# Patient Record
Sex: Male | Born: 1980
Health system: Southern US, Community
[De-identification: ages and names within clinical notes are randomized; demographics above are authoritative.]

## PROBLEM LIST (undated history)

## (undated) DIAGNOSIS — G43909 Migraine, unspecified, not intractable, without status migrainosus: Secondary | ICD-10-CM

## (undated) HISTORY — DX: Migraine, unspecified, not intractable, without status migrainosus: G43.909

---
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
## 2004-02-17 ENCOUNTER — Encounter: Admission: RE | Admit: 2004-02-17 | Discharge: 2004-02-17 | Payer: Self-pay | Admitting: Family Medicine

## 2004-07-04 ENCOUNTER — Ambulatory Visit: Payer: Self-pay | Admitting: Internal Medicine

## 2005-09-05 ENCOUNTER — Ambulatory Visit: Payer: Self-pay | Admitting: Internal Medicine

## 2005-12-25 ENCOUNTER — Ambulatory Visit: Payer: Self-pay | Admitting: Internal Medicine

## 2007-03-05 ENCOUNTER — Ambulatory Visit: Payer: Self-pay | Admitting: Internal Medicine

## 2007-05-22 ENCOUNTER — Ambulatory Visit: Payer: Self-pay | Admitting: Family Medicine

## 2007-05-22 LAB — CONVERTED CEMR LAB: Rapid Strep: POSITIVE

## 2007-07-12 ENCOUNTER — Emergency Department (HOSPITAL_COMMUNITY): Admission: EM | Admit: 2007-07-12 | Discharge: 2007-07-12 | Payer: Self-pay | Admitting: Emergency Medicine

## 2008-06-06 ENCOUNTER — Ambulatory Visit: Payer: Self-pay | Admitting: Internal Medicine

## 2008-06-06 DIAGNOSIS — J069 Acute upper respiratory infection, unspecified: Secondary | ICD-10-CM | POA: Insufficient documentation
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
## 2011-09-06 ENCOUNTER — Ambulatory Visit (INDEPENDENT_AMBULATORY_CARE_PROVIDER_SITE_OTHER): Payer: Managed Care, Other (non HMO) | Admitting: Family Medicine

## 2011-09-06 ENCOUNTER — Encounter: Payer: Self-pay | Admitting: Family Medicine

## 2011-09-06 VITALS — BP 112/76 | HR 72 | Temp 98.3°F | Wt 201.0 lb

## 2011-09-06 DIAGNOSIS — J069 Acute upper respiratory infection, unspecified: Secondary | ICD-10-CM

## 2011-09-06 DIAGNOSIS — J029 Acute pharyngitis, unspecified: Secondary | ICD-10-CM

## 2011-09-06 MED ORDER — FLUTICASONE PROPIONATE 50 MCG/ACT NA SUSP: 2.0000 | Freq: Every day | NASAL | Status: AC

## 2011-09-06 NOTE — Patient Instructions (Signed)
This appears to be a viral/allergy combo Continue the claritin Add the Flonase- 2 sprays each nostril daily Drink plenty of fluids Ibuprofen as needed for pain or fever REST! Call with any questions or concerns Hang in there!!!

## 2011-09-06 NOTE — Progress Notes (Signed)
  Subjective:    Patient ID: Charles Mccormick, male    DOB: 05/14/1981, 31 y.o.   MRN: [id number]  HPI URI- sxs started 1 week ago w/ sore throat, nasal congestion, + PND.  No facial pain/pressure.  Bilateral ear fullness.  Tm 101 yesterday.  + sick contacts.  Cough is productive.   Review of Systems For ROS see HPI     Objective:   Physical Exam  Vitals reviewed. Constitutional: He appears well-developed and well-nourished. No distress.  HENT:  Head: Normocephalic and atraumatic.       No TTP over sinuses + turbinate edema + PND TMs normal bilaterally  Eyes: Conjunctivae and EOM are normal. Pupils are equal, round, and reactive to light.  Neck: Normal range of motion. Neck supple.  Cardiovascular: Normal rate, regular rhythm and normal heart sounds.   Pulmonary/Chest: Effort normal and breath sounds normal. No respiratory distress. He has no wheezes.  Lymphadenopathy:    He has no cervical adenopathy.  Skin: Skin is warm and dry.          Assessment & Plan:

## 2011-09-08 NOTE — Assessment & Plan Note (Signed)
Pt's sxs and PE consistent w/ viral/allergy combo.  Rapid strep (-) despite recent exposure.  Reviewed supportive care and red flags that should prompt return.  Pt expressed understanding and is in agreement w/ plan.

## 2017-02-10 ENCOUNTER — Ambulatory Visit (INDEPENDENT_AMBULATORY_CARE_PROVIDER_SITE_OTHER): Payer: Managed Care, Other (non HMO) | Admitting: Family Medicine

## 2017-02-10 ENCOUNTER — Encounter: Payer: Self-pay | Admitting: Family Medicine

## 2017-02-10 VITALS — BP 132/82 | HR 71 | Temp 98.3°F | Ht 74.0 in | Wt 191.4 lb

## 2017-02-10 DIAGNOSIS — Z0000 Encounter for general adult medical examination without abnormal findings: Secondary | ICD-10-CM

## 2017-02-10 NOTE — Progress Notes (Signed)
Subjective:    Charles Mccormick is a 36 y.o. male and is here for a comprehensive physical exam.  HPI   Usual eating pattern includes: The patient eats a regular, healthy diet..     Usual physical activity includes doing aerobic activity.  Current life stressors: none.  Wt Readings from Last 3 Encounters:  02/10/17 191 lb 7.2 oz (86.8 kg)  09/06/11 201 lb (91.2 kg)  06/06/08 200 lb 3.2 oz (90.8 kg)   Health Maintenance Due  Topic Date Due  . HIV Screening  04/25/1996  . INFLUENZA VACCINE  01/29/2017   PMHx, SurgHx, SocialHx, Medications, and Allergies were reviewed in the Visit Navigator and updated as appropriate.   Past Medical History:  Diagnosis Date  . Migraines    History reviewed. No pertinent surgical history. History reviewed. No pertinent family history. Social History  Substance Use Topics  . Smoking status: Never Smoker  . Smokeless tobacco: Never Used  . Alcohol use Yes     Comment: occasionally   Review of Systems:   Pertinent items are noted in the HPI. Otherwise, ROS is negative.  Objective:   Vitals:   02/10/17 1530  BP: 132/82  Pulse: 71  Temp: 98.3 F (36.8 C)  SpO2: 97%   Body mass index is 24.58 kg/m.  General Appearance:  Alert, cooperative, no distress, appears stated age  Head:  Normocephalic, without obvious abnormality, atraumatic  Eyes:  PERRL, conjunctiva/corneas clear, EOM's intact, fundi benign, both eyes       Ears:  Normal TM's and external ear canals, both ears  Nose: Nares normal, septum midline, mucosa normal, no drainage    or sinus tenderness  Throat: Lips, mucosa, and tongue normal; teeth and gums normal  Neck: Supple, symmetrical, trachea midline, no adenopathy; thyroid:  No enlargement/tenderness/nodules; no carotit bruit or JVD  Back:   Symmetric, no curvature, ROM normal, no CVA tenderness  Lungs:   Clear to auscultation bilaterally, respirations unlabored  Chest wall:  No tenderness or deformity  Heart:   Regular rate and rhythm, S1 and S2 normal, no murmur, rub   or gallop  Abdomen:   Soft, non-tender, bowel sounds active all four quadrants, no masses, no organomegaly  Extremities: Extremities normal, atraumatic, no cyanosis or edema  Prostate: Not done.   Skin: Skin color, texture, turgor normal, no rashes or lesions  Lymph nodes: Cervical, supraclavicular, and axillary nodes normal  Neurologic: CNII-XII grossly intact. Normal strength, sensation and reflexes throughout    Assessment/Plan:   Charles Mccormick was seen today for establish care.  Diagnoses and all orders for this visit:  Routine physical examination -     Comprehensive metabolic panel -     Lipid panel   Patient Counseling: [x]   Nutrition: Stressed importance of moderation in sodium/caffeine intake, saturated fat and cholesterol, caloric balance, sufficient intake of fresh fruits, vegetables, and fiber.  [x]   Stressed the importance of regular exercise.   [x]   Substance Abuse: Discussed cessation/primary prevention of tobacco, alcohol, or other drug use; driving or other dangerous activities under the influence; availability of treatment for abuse.   [x]   Injury prevention: Discussed safety belts, safety helmets, smoke detector, smoking near bedding or upholstery.   [x]   Sexuality: Discussed sexually transmitted diseases, partner selection, use of condoms, avoidance of unintended pregnancy and contraceptive alternatives.   [x]   Dental health: Discussed importance of regular tooth brushing, flossing, and dental visits.  [x]   Health maintenance and immunizations reviewed. Please refer to Health maintenance  section.   Charles RimaErica Avan Gullett, DO [place] [facility]  [place] served as Neurosurgeonscribe during this visit. History, Physical, and Plan performed by medical provider. The above documentation has been reviewed and is accurate and complete. Charles Mccormick, D.O.

## 2017-02-10 NOTE — Progress Notes (Deleted)
   Charles Mccormick is a 36 y.o. male [facility]   Patient Care Team: Helane RimaWallace, Erica, DO as PCP - General (Family Medicine)   History of Present Illness:   Charles Mccormick, CMA, acting as scribe for Dr. Earlene PlaterWallace.  HPI:  Health Maintenance Due  Topic Date Due  . HIV Screening  04/25/1996  . INFLUENZA VACCINE  01/29/2017   Depression screen PHQ 2/9 02/10/2017  Decreased Interest 0  Down, Depressed, Hopeless 0  PHQ - 2 Score 0   PMHx, SurgHx, SocialHx, Medications, and Allergies were reviewed in the Visit Navigator and updated as appropriate.   Past Medical History:  Diagnosis Date  . Migraines    History reviewed. No pertinent surgical history. History reviewed. No pertinent family history. Social History  Substance Use Topics  . Smoking status: Never Smoker  . Smokeless tobacco: Never Used  . Alcohol use Yes     Comment: occasionally    Current Medications and Allergies:  No current outpatient prescriptions on file.  No Known Allergies Review of Systems:   Pertinent items are noted in the HPI. Otherwise, ROS is negative.  Vitals:   Vitals:   02/10/17 1530  BP: 132/82  Pulse: 71  Temp: 98.3 F (36.8 C)  TempSrc: Oral  SpO2: 97%  Weight: 191 lb 7.2 oz (86.8 kg)  Height: 6\' 2"  (1.88 m)     Body mass index is 24.58 kg/m. Physical Exam:   Physical Exam  Results for orders placed or performed in visit on 05/22/07  Converted [facility]  Result Value Ref Range   Rapid Strep positive     Assessment and Plan:   ***

## 2017-02-18 ENCOUNTER — Telehealth: Payer: Self-pay | Admitting: Family Medicine

## 2017-02-18 NOTE — Telephone Encounter (Signed)
ROI faxed to [facility]

## 2017-02-24 ENCOUNTER — Telehealth: Payer: Self-pay | Admitting: Family Medicine

## 2017-02-24 MED ORDER — SUMATRIPTAN SUCCINATE 50 MG PO TABS: 50.0000 mg | ORAL_TABLET | ORAL | 0 refills | Status: DC | PRN

## 2017-02-24 NOTE — Telephone Encounter (Signed)
Known migraines, so reasonable request. Rx sent to pharmacy.

## 2017-02-24 NOTE — Telephone Encounter (Signed)
Wife requesting new Script for Imetrex.  [facility] [address]  [place]

## 2017-02-24 NOTE — Telephone Encounter (Signed)
Please advise on prescription. We do not have this medication on file.

## 2017-02-25 NOTE — Telephone Encounter (Signed)
Left message that RX has been sent to pharmacy.

## 2017-03-05 ENCOUNTER — Other Ambulatory Visit (INDEPENDENT_AMBULATORY_CARE_PROVIDER_SITE_OTHER): Payer: Managed Care, Other (non HMO)

## 2017-03-05 DIAGNOSIS — Z0000 Encounter for general adult medical examination without abnormal findings: Secondary | ICD-10-CM

## 2017-03-05 LAB — COMPREHENSIVE METABOLIC PANEL
ALT: 21 U/L (ref 0–53)
AST: 18 U/L (ref 0–37)
Albumin: 4.8 g/dL (ref 3.5–5.2)
Alkaline Phosphatase: 72 U/L (ref 39–117)
BUN: 10 mg/dL (ref 6–23)
CO2: 29 mEq/L (ref 19–32)
Calcium: 9.8 mg/dL (ref 8.4–10.5)
Chloride: 100 mEq/L (ref 96–112)
Creatinine, Ser: 0.96 mg/dL (ref 0.40–1.50)
GFR: 94.27 mL/min (ref >60.00)
Glucose, Bld: 95 mg/dL (ref 70–99)
Potassium: 4.4 mEq/L (ref 3.5–5.1)
Sodium: 137 mEq/L (ref 135–145)
Total Bilirubin: 1.1 mg/dL (ref 0.2–1.2)
Total Protein: 7 g/dL (ref 6.0–8.3)

## 2017-03-05 LAB — LIPID PANEL
Cholesterol: 173 mg/dL (ref 0–200)
HDL: 41 mg/dL (ref >39.00)
LDL Cholesterol: 112 mg/dL — ABNORMAL HIGH (ref 0–99)
NonHDL: 131.9
Total CHOL/HDL Ratio: 4
Triglycerides: 98 mg/dL (ref 0.0–149.0)
VLDL: 19.6 mg/dL (ref 0.0–40.0)

## 2017-06-02 ENCOUNTER — Telehealth: Payer: Self-pay

## 2017-06-02 NOTE — Telephone Encounter (Signed)
Please advise 

## 2017-06-02 NOTE — Telephone Encounter (Signed)
Please get more information.

## 2017-06-02 NOTE — Telephone Encounter (Signed)
Left message for patient to return call. We need to get more information on headaches. We need to find out if his headaches are worse and if he has had it more than a week.

## 2017-06-02 NOTE — Telephone Encounter (Signed)
Currently on Imitrex 50 mg po.

## 2017-06-02 NOTE — Telephone Encounter (Signed)
Left message to return call to our office.  

## 2017-06-02 NOTE — Telephone Encounter (Signed)
Pt.'s wife called to report pt. Is still having migraines and has an appointment next week to see provider. States he used to take Imitrex 100 mg po from a previous provider and is requesting an increase in his dosage. Please advise . [facility]. [facility] [address]

## 2017-06-03 ENCOUNTER — Ambulatory Visit: Payer: Self-pay

## 2017-06-04 MED ORDER — SUMATRIPTAN SUCCINATE 100 MG PO TABS: 100.0000 mg | ORAL_TABLET | ORAL | 0 refills | Status: DC | PRN

## 2017-06-04 NOTE — Telephone Encounter (Signed)
Pt wife in office today on DPR. She states that headaches are increasing in pain. That he has had one every day this week. It gets some better over night but increases through day. No changes in vision. He does not have vomiting with headache. He is has been taking  Imitrex 50mg  and otc Advil. Wants to know if we can up Imitrex to 100mg . Until he can get in for follow up with you. Has app for next Tuesday for evaluation with you.   Please call wife back with instructions husband will not answer while at work.  [phone]

## 2017-06-04 NOTE — Addendum Note (Signed)
Addended by: Donnamarie PoagHOMPSON, Marc Sivertsen Y on: 06/04/2017 12:09 PM   Modules accepted: Orders

## 2017-06-04 NOTE — Telephone Encounter (Signed)
Increased called in and called wife to give all information.

## 2017-06-04 NOTE — Telephone Encounter (Signed)
Okay 100 mg Imitrex. Make sure to keep appt.

## 2017-06-10 ENCOUNTER — Ambulatory Visit: Payer: Managed Care, Other (non HMO) | Admitting: Family Medicine

## 2017-06-20 ENCOUNTER — Ambulatory Visit: Payer: Managed Care, Other (non HMO) | Admitting: Family Medicine

## 2017-07-08 ENCOUNTER — Ambulatory Visit: Payer: Managed Care, Other (non HMO) | Admitting: Family Medicine

## 2017-07-18 ENCOUNTER — Ambulatory Visit: Payer: Managed Care, Other (non HMO) | Admitting: Family Medicine

## 2017-07-18 VITALS — BP 116/74 | HR 78 | Temp 98.1°F | Ht 74.0 in | Wt 192.8 lb

## 2017-07-18 DIAGNOSIS — H01136 Eczematous dermatitis of left eye, unspecified eyelid: Secondary | ICD-10-CM

## 2017-07-18 DIAGNOSIS — G43009 Migraine without aura, not intractable, without status migrainosus: Secondary | ICD-10-CM

## 2017-07-18 DIAGNOSIS — L209 Atopic dermatitis, unspecified: Secondary | ICD-10-CM | POA: Diagnosis not present

## 2017-07-18 MED ORDER — TRIAMCINOLONE ACETONIDE 0.025 % EX OINT: 1.0000 "application " | TOPICAL_OINTMENT | Freq: Two times a day (BID) | CUTANEOUS | 0 refills | Status: DC

## 2017-07-18 MED ORDER — TACROLIMUS 0.03 % EX OINT: TOPICAL_OINTMENT | Freq: Two times a day (BID) | CUTANEOUS | 0 refills | Status: AC

## 2017-07-18 MED ORDER — SUMATRIPTAN SUCCINATE 100 MG PO TABS: 100.0000 mg | ORAL_TABLET | ORAL | 12 refills | Status: DC | PRN

## 2017-07-18 NOTE — Progress Notes (Signed)
Charles Mccormick is a 37 y.o. male is here for follow up.  History of Present Illness:   Charles Mccormick CMA acting as scribe for Dr. Earlene PlaterWallace.  HPI: Patient comes in today to discuss migraine medication. His last migraine was about a week ago. He does have a small headache today.    There are no preventive care reminders to display for this patient.   Depression screen PHQ 2/9 02/10/2017  Decreased Interest 0  Down, Depressed, Hopeless 0  PHQ - 2 Score 0   PMHx, SurgHx, SocialHx, FamHx, Medications, and Allergies were reviewed in the Visit Navigator and updated as appropriate.   Patient Active Problem List   Diagnosis Date Noted  . Routine physical examination 02/10/2017  . URI 06/06/2008   Social History   Tobacco Use  . Smoking status: Never Smoker  . Smokeless tobacco: Never Used  Substance Use Topics  . Alcohol use: Yes    Comment: occasionally  . Drug use: No   Current Medications and Allergies:   .  SUMAtriptan (IMITREX) 100 MG tablet, Take 1 tablet (100 mg total) by mouth every 2 (two) hours as needed for migraine. May repeat in 2 hours if headache persists or recurs., Disp: 10 tablet, Rfl: 0  No Known Allergies   Review of Systems   Pertinent items are noted in the HPI. Otherwise, ROS is negative.  Vitals:   Vitals:   07/18/17 1402  BP: 116/74  Pulse: 78  Temp: 98.1 F (36.7 C)  TempSrc: Oral  SpO2: 97%  Weight: 192 lb 12.8 oz (87.5 kg)  Height: 6\' 2"  (1.88 m)     Body mass index is 24.75 kg/m.  Physical Exam:   Physical Exam  Constitutional: He is oriented to person, place, and time. He appears well-developed and well-nourished. No distress.  HENT:  Head: Normocephalic and atraumatic.  Right Ear: External ear normal.  Left Ear: External ear normal.  Nose: Nose normal.  Mouth/Throat: Oropharynx is clear and moist.  Eyes: Conjunctivae and EOM are normal. Pupils are equal, round, and reactive to light.  Neck: Normal range of motion. Neck  supple.  Cardiovascular: Normal rate, regular rhythm, normal heart sounds and intact distal pulses.  Pulmonary/Chest: Effort normal and breath sounds normal.  Abdominal: Soft. Bowel sounds are normal.  Musculoskeletal: Normal range of motion.  Neurological: He is alert and oriented to person, place, and time.  Skin: Skin is warm and dry.  Eczematous lesions on his left upper eyelid and his hand.  Psychiatric: He has a normal mood and affect. His behavior is normal. Judgment and thought content normal.  Nursing note and vitals reviewed.   Assessment and Plan:   1. Migraine without aura and without status migrainosus, not intractable Well controlled.  No signs of complications, medication side effects, or red flags.  Continue current regimen.    - SUMAtriptan (IMITREX) 100 MG tablet; Take 1 tablet (100 mg total) by mouth every 2 (two) hours as needed for migraine. May repeat in 2 hours if headache persists or recurs.  Dispense: 10 tablet; Refill: 12  2. Atopic dermatitis of left eyelid - tacrolimus (PROTOPIC) 0.03 % ointment; Apply topically 2 (two) times daily.  Dispense: 100 g; Refill: 0  3. Atopic dermatitis, mild - triamcinolone (KENALOG) 0.025 % ointment; Apply 1 application topically 2 (two) times daily.  Dispense: 30 g; Refill: 0   . Reviewed expectations re: course of current medical issues. . Discussed self-management of symptoms. . Outlined signs and  symptoms indicating need for more acute intervention. . Patient verbalized understanding and all questions were answered. Marland Kitchen Health Maintenance issues including appropriate healthy diet, exercise, and smoking avoidance were discussed with patient. . See orders for this visit as documented in the electronic medical record. . Patient received an After Visit Summary.  CMA served as Neurosurgeon during this visit. History, Physical, and Plan performed by medical provider. The above documentation has been reviewed and is accurate and  complete. Helane Rima, D.O.  Helane Rima, DO [place] 07/19/2017

## 2017-07-19 DIAGNOSIS — G43009 Migraine without aura, not intractable, without status migrainosus: Secondary | ICD-10-CM | POA: Insufficient documentation

## 2017-07-24 ENCOUNTER — Encounter: Payer: Self-pay | Admitting: Family Medicine

## 2017-07-24 ENCOUNTER — Ambulatory Visit: Payer: Managed Care, Other (non HMO) | Admitting: Family Medicine

## 2017-07-24 VITALS — BP 110/72 | HR 76 | Temp 98.6°F | Wt 196.8 lb

## 2017-07-24 DIAGNOSIS — J060 Acute laryngopharyngitis: Secondary | ICD-10-CM

## 2017-07-24 MED ORDER — AZITHROMYCIN 250 MG PO TABS: ORAL_TABLET | ORAL | 0 refills | Status: DC

## 2017-07-24 MED ORDER — GUAIFENESIN-CODEINE 100-10 MG/5ML PO SYRP: 10.0000 mL | ORAL_SOLUTION | Freq: Every evening | ORAL | 0 refills | Status: DC | PRN

## 2017-07-24 MED ORDER — PREDNISONE 5 MG PO TABS: ORAL_TABLET | ORAL | 0 refills | Status: DC

## 2017-07-24 NOTE — Progress Notes (Signed)
Nathanial RancherBenjamin T Gelber is a 37 y.o. male here for an acute visit.  History of Present Illness:   Barnie MortJoEllen Thompson, CMA acting as scribe for Dr. Helane RimaErica Deronte Solis.   Cough  This is a new problem. The current episode started in the past 7 days. The problem has been gradually worsening. The cough is productive of sputum. Associated symptoms include ear pain, headaches and a sore throat. Pertinent negatives include no ear congestion, fever or wheezing. The symptoms are aggravated by lying down. He has tried nothing for the symptoms. The treatment provided no relief. There is no history of asthma, COPD or emphysema.   PMHx, SurgHx, SocialHx, Medications, and Allergies were reviewed in the Visit Navigator and updated as appropriate.  Current Medications:   .  SUMAtriptan (IMITREX) 100 MG tablet, Take 1 tablet (100 mg total) by mouth every 2 (two) hours as needed for migraine. May repeat in 2 hours if headache persists or recurs., Disp: 10 tablet, Rfl: 12 .  tacrolimus (PROTOPIC) 0.03 % ointment, Apply topically 2 (two) times daily., Disp: 100 g, Rfl: 0 .  triamcinolone (KENALOG) 0.025 % ointment, Apply 1 application topically 2 (two) times daily., Disp: 30 g, Rfl: 0   No Known Allergies   Review of Systems:   Pertinent items are noted in the HPI. Otherwise, ROS is negative.  Vitals:   Vitals:   07/24/17 1304  BP: 110/72  Pulse: 76  Temp: 98.6 F (37 C)  TempSrc: Oral  SpO2: 97%  Weight: 196 lb 12.8 oz (89.3 kg)     Body mass index is 25.27 kg/m.  Physical Exam:   Physical Exam  Constitutional: He is oriented to person, place, and time. He appears well-developed and well-nourished. No distress.  HENT:  Head: Normocephalic and atraumatic.  Right Ear: External ear normal.  Left Ear: External ear normal.  Nose: Mucosal edema and rhinorrhea present.  Mouth/Throat: Posterior oropharyngeal erythema present.  Eyes: Conjunctivae and EOM are normal. Pupils are equal, round, and reactive to  light.  Neck: Normal range of motion. Neck supple.  Cardiovascular: Normal rate, regular rhythm, normal heart sounds and intact distal pulses.  Pulmonary/Chest: Effort normal and breath sounds normal.  Cough.  Abdominal: Soft. Bowel sounds are normal.  Musculoskeletal: Normal range of motion.  Neurological: He is alert and oriented to person, place, and time.  Skin: Skin is warm and dry.  Psychiatric: He has a normal mood and affect. His behavior is normal. Judgment and thought content normal.  Nursing note and vitals reviewed.  Assessment and Plan:   1. Acute laryngopharyngitis Maintain hydration by drinking small amounts of clear fluids frequently. Symptomatic care and red flags reviewed.   - azithromycin (ZITHROMAX) 250 MG tablet; 2 po on day one, then one po daily until gone  (SAFETY NET TO HOLD) - guaiFENesin-codeine (CHERATUSSIN AC) 100-10 MG/5ML syrup; Take 10 mLs by mouth at bedtime as needed for cough or congestion.  Dispense: 120 mL; Refill: 0 - predniSONE (DELTASONE) 5 MG tablet; 6-5-4-3-2-1-off  Dispense: 21 tablet; Refill: 0   . Reviewed expectations re: course of current medical issues. . Discussed self-management of symptoms. . Outlined signs and symptoms indicating need for more acute intervention. . Patient verbalized understanding and all questions were answered. Marland Kitchen. Health Maintenance issues including appropriate healthy diet, exercise, and smoking avoidance were discussed with patient. . See orders for this visit as documented in the electronic medical record. . Patient received an After Visit Summary.  CMA served as Neurosurgeonscribe during  this visit. History, Physical, and Plan performed by medical provider. The above documentation has been reviewed and is accurate and complete. Helane Rima, D.O.   Helane Rima, DO [place] 07/24/2017

## 2017-08-19 ENCOUNTER — Encounter: Payer: Self-pay | Admitting: Family Medicine

## 2017-08-19 ENCOUNTER — Ambulatory Visit: Payer: 59 | Admitting: Family Medicine

## 2017-08-19 VITALS — BP 118/68 | HR 79 | Temp 98.2°F | Ht 74.0 in | Wt 190.6 lb

## 2017-08-19 DIAGNOSIS — R0981 Nasal congestion: Secondary | ICD-10-CM | POA: Diagnosis not present

## 2017-08-19 DIAGNOSIS — R509 Fever, unspecified: Secondary | ICD-10-CM | POA: Diagnosis not present

## 2017-08-19 DIAGNOSIS — R52 Pain, unspecified: Secondary | ICD-10-CM

## 2017-08-19 LAB — POCT INFLUENZA A/B
Influenza A, POC: NEGATIVE
Influenza B, POC: NEGATIVE

## 2017-08-19 MED ORDER — ONDANSETRON 8 MG PO TBDP: 8.0000 mg | ORAL_TABLET | Freq: Three times a day (TID) | ORAL | 0 refills | Status: DC | PRN

## 2017-08-19 NOTE — Progress Notes (Signed)
   Subjective:  Charles RancherBenjamin T Bennis is a 37 y.o. male who presents today for same-day appointment with a chief complaint of diarrhea.   HPI:  Diarrhea, Acute Issue Started 2 days ago.  Improved over last day.  Associated with lower abdominal pain, fever to 101 F, vomiting, and muscle aches.  Also reports upper respiratory symptoms including cough, nasal congestion, and rhinorrhea.  He has had a child who was recently diagnosed with Norovirus.  He has been able to keep down some crackers and water.  No hematochezia.  No melena.  No treatments tried.  No other obvious alleviating or aggravating factors.  ROS: Per HPI  PMH: He reports that  has never smoked. he has never used smokeless tobacco. He reports that he drinks alcohol. He reports that he does not use drugs.  Objective:  Physical Exam: BP 118/68 (BP Location: Left Arm, Patient Position: Sitting, Cuff Size: Normal)   Pulse 79   Temp 98.2 F (36.8 C) (Oral)   Ht 6\' 2"  (1.88 m)   Wt 190 lb 9.6 oz (86.5 kg)   SpO2 96%   BMI 24.47 kg/m   Gen: NAD, resting comfortably CV: RRR with no murmurs appreciated Pulm: NWOB, CTAB with no crackles, wheezes, or rhonchi GI: Normal bowel sounds present. Soft, Nontender, Nondistended.  Results for orders placed or performed in visit on 08/19/17 (from the past 24 hour(s))  POCT Influenza A/B     Status: None   Collection Time: 08/19/17  9:11 AM  Result Value Ref Range   Influenza A, POC Negative Negative   Influenza B, POC Negative Negative   Assessment/Plan:  Diarrhea Likely viral gastroenteritis.  His abdominal exam is benign and he does not have any red flag signs or symptoms.  His rapid flu test is negative.  We will treat symptomatically.  Sent in a prescription for Zofran.  Also recommended Imodium for his diarrhea.  Encouraged good oral hydration.  Recommended Tylenol and/or Motrin as needed for pain and fever.  Strict return precautions reviewed.  Follow-up as needed.  Katina Degreealeb M. Jimmey RalphParker,  MD 08/19/2017 9:13 AM

## 2017-08-19 NOTE — Patient Instructions (Signed)
Please start the Zofran.  You can take every 8 hours over the next couple days.  After that, you can take as needed.  Please start Imodium for your diarrhea.  Please stay well-hydrated.  You can take Tylenol and/or Motrin as needed for pain and fever.  Let me know if your symptoms worsen or do not improve over the next several days.  Take care, Dr. Jimmey RalphParker

## 2017-09-03 ENCOUNTER — Other Ambulatory Visit: Payer: Self-pay

## 2017-09-03 ENCOUNTER — Ambulatory Visit: Payer: Self-pay | Admitting: *Deleted

## 2017-09-03 MED ORDER — OSELTAMIVIR PHOSPHATE 75 MG PO CAPS: 75.0000 mg | ORAL_CAPSULE | Freq: Every day | ORAL | 0 refills | Status: DC

## 2017-09-03 NOTE — Telephone Encounter (Signed)
Patient informed meds called in

## 2017-09-03 NOTE — Telephone Encounter (Signed)
Okay to call in prophylaxis.  Tamiflu 75 daily times 7 days.

## 2017-09-03 NOTE — Telephone Encounter (Addendum)
Called pt's wife back, Amy, she states that their 37 year old was diagnosed with the flu, and all 5 family members have just gotten a stomach bug; she also states that the pt is not exhibiting any symptoms of the flu and is unable to call because he is at work. Pt can be contacted at [phone]; will route to office for final disposition. [facility] [address] [place].   Reason for Disposition . [1] Influenza EXPOSURE within last 72 hours (3 days) AND [2] NOT HIGH RISK AND [3] strongly requests antiviral medication  Answer Assessment - Initial Assessment Questions 1. TYPE of EXPOSURE: "How were you exposed?" (e.g., close contact, not a close contact)     37 year old 2. DATE of EXPOSURE: "When did the exposure occur?" (e.g., hour, days, weeks)     1; child woke up with it yesterday  3. PREGNANCY: "Is there any chance you are pregnant?" "When was your last menstrual period?"     n/a 4. HIGH RISK for COMPLICATIONS: "Do you have any heart or lung problems? Do you have a weakened immune system?" (e.g., CHF, COPD, asthma, HIV positive, chemotherapy, renal failure, diabetes mellitus, sickle cell anemia)     no 5. SYMPTOMS: "Do you have any symptoms?" (e.g., cough, fever, sore throat, difficulty breathing).     no  Protocols used: INFLUENZA EXPOSURE-A-AH

## 2018-02-13 ENCOUNTER — Encounter: Payer: 59 | Admitting: Family Medicine

## 2018-04-07 NOTE — Progress Notes (Signed)
Subjective:    Charles Mccormick is a 37 y.o. male who presents today for his Complete Annual Exam.    Current Outpatient Medications:  .  SUMAtriptan (IMITREX) 100 MG tablet, Take 1 tablet (100 mg total) by mouth every 2 (two) hours as needed for migraine. May repeat in 2 hours if headache persists or recurs., Disp: 10 tablet, Rfl: 12 .  tacrolimus (PROTOPIC) 0.03 % ointment, Apply topically 2 (two) times daily., Disp: 100 g, Rfl: 0 .  triamcinolone (KENALOG) 0.025 % ointment, Apply 1 application topically 2 (two) times daily., Disp: 30 g, Rfl: 0  Health Maintenance Due  Topic Date Due  . INFLUENZA VACCINE  01/29/2018    PMHx, SurgHx, SocialHx, Medications, and Allergies were reviewed in the Visit Navigator and updated as appropriate.   Past Medical History:  Diagnosis Date  . Migraines    History reviewed. No pertinent surgical history.  History reviewed. No pertinent family history.  Social History   Tobacco Use  . Smoking status: Never Smoker  . Smokeless tobacco: Never Used  Substance Use Topics  . Alcohol use: Yes    Comment: occasionally  . Drug use: No   Review of Systems:   Pertinent items are noted in the HPI. Otherwise, ROS is negative.  Objective:   Vitals:   04/08/18 0724  BP: 110/62  Pulse: 60  Temp: 98.2 F (36.8 C)  SpO2: 98%   Body mass index is 25.55 kg/m.  General Appearance:  Alert, cooperative, no distress, appears stated age  Head:  Normocephalic, without obvious abnormality, atraumatic  Eyes:  PERRL, conjunctiva/corneas clear, EOM's intact, fundi benign, both eyes       Ears:  Normal TM's and external ear canals, both ears  Nose: Nares normal, septum midline, mucosa normal, no drainage    or sinus tenderness  Throat: Lips, mucosa, and tongue normal; teeth and gums normal  Neck: Supple, symmetrical, trachea midline, no adenopathy; thyroid:  No enlargement/tenderness/nodules; no carotit bruit or JVD  Back:   Symmetric, no curvature,  ROM normal, no CVA tenderness  Lungs:   Clear to auscultation bilaterally, respirations unlabored  Chest wall:  No tenderness or deformity  Heart:  Regular rate and rhythm, S1 and S2 normal, no murmur, rub   or gallop  Abdomen:   Soft, non-tender, bowel sounds active all four quadrants, no masses, no organomegaly  Extremities: Extremities normal, atraumatic, no cyanosis or edema  Prostate: Not done.   Skin: Skin color, texture, turgor normal, no rashes or lesions  Lymph nodes: Cervical, supraclavicular, and axillary nodes normal  Neurologic: CNII-XII grossly intact. Normal strength, sensation and reflexes throughout    Assessment/Plan:   Casson was seen today for follow-up and annual exam.  Diagnoses and all orders for this visit:  Routine physical examination  Pure hypercholesterolemia -     Comprehensive metabolic panel -     Lipid panel  Atopic dermatitis, mild -     CBC with Differential/Platelet   Patient Counseling: [x]   Nutrition: Stressed importance of moderation in sodium/caffeine intake, saturated fat and cholesterol, caloric balance, sufficient intake of fresh fruits, vegetables, and fiber.  [x]   Stressed the importance of regular exercise.   []   Substance Abuse: Discussed cessation/primary prevention of tobacco, alcohol, or other drug use; driving or other dangerous activities under the influence; availability of treatment for abuse.   [x]   Injury prevention: Discussed safety belts, safety helmets, smoke detector, smoking near bedding or upholstery.   []   Sexuality: Discussed  sexually transmitted diseases, partner selection, use of condoms, avoidance of unintended pregnancy and contraceptive alternatives.   [x]   Dental health: Discussed importance of regular tooth brushing, flossing, and dental visits.  [x]   Health maintenance and immunizations reviewed. Please refer to Health maintenance section.    Helane Rima, DO [facility]

## 2018-04-08 ENCOUNTER — Ambulatory Visit (INDEPENDENT_AMBULATORY_CARE_PROVIDER_SITE_OTHER): Payer: 59 | Admitting: Family Medicine

## 2018-04-08 ENCOUNTER — Encounter: Payer: Self-pay | Admitting: Family Medicine

## 2018-04-08 VITALS — BP 110/62 | HR 60 | Temp 98.2°F | Ht 74.0 in | Wt 199.0 lb

## 2018-04-08 DIAGNOSIS — Z0000 Encounter for general adult medical examination without abnormal findings: Secondary | ICD-10-CM | POA: Diagnosis not present

## 2018-04-08 DIAGNOSIS — E7800 Pure hypercholesterolemia, unspecified: Secondary | ICD-10-CM | POA: Diagnosis not present

## 2018-04-08 DIAGNOSIS — L209 Atopic dermatitis, unspecified: Secondary | ICD-10-CM | POA: Insufficient documentation

## 2018-04-08 DIAGNOSIS — E785 Hyperlipidemia, unspecified: Secondary | ICD-10-CM | POA: Insufficient documentation

## 2018-04-08 LAB — CBC WITH DIFFERENTIAL/PLATELET
Basophils Absolute: 0.1 10*3/uL (ref 0.0–0.1)
Basophils Relative: 1 % (ref 0.0–3.0)
Eosinophils Absolute: 0.1 10*3/uL (ref 0.0–0.7)
Eosinophils Relative: 2.4 % (ref 0.0–5.0)
HCT: 47.3 % (ref 39.0–52.0)
Hemoglobin: 16.2 g/dL (ref 13.0–17.0)
Lymphocytes Relative: 29.4 % (ref 12.0–46.0)
Lymphs Abs: 1.7 10*3/uL (ref 0.7–4.0)
MCHC: 34.2 g/dL (ref 30.0–36.0)
MCV: 85 fl (ref 78.0–100.0)
Monocytes Absolute: 0.5 10*3/uL (ref 0.1–1.0)
Monocytes Relative: 9.3 % (ref 3.0–12.0)
Neutro Abs: 3.4 10*3/uL (ref 1.4–7.7)
Neutrophils Relative %: 57.9 % (ref 43.0–77.0)
Platelets: 251 10*3/uL (ref 150.0–400.0)
RBC: 5.57 Mil/uL (ref 4.22–5.81)
RDW: 12.9 % (ref 11.5–15.5)
WBC: 5.9 10*3/uL (ref 4.0–10.5)

## 2018-04-08 LAB — COMPREHENSIVE METABOLIC PANEL
ALT: 27 U/L (ref 0–53)
AST: 21 U/L (ref 0–37)
Albumin: 4.5 g/dL (ref 3.5–5.2)
Alkaline Phosphatase: 68 U/L (ref 39–117)
BUN: 15 mg/dL (ref 6–23)
CO2: 29 mEq/L (ref 19–32)
Calcium: 9.3 mg/dL (ref 8.4–10.5)
Chloride: 103 mEq/L (ref 96–112)
Creatinine, Ser: 0.99 mg/dL (ref 0.40–1.50)
GFR: 90.43 mL/min (ref >60.00)
Glucose, Bld: 101 mg/dL — ABNORMAL HIGH (ref 70–99)
Potassium: 3.8 mEq/L (ref 3.5–5.1)
Sodium: 138 mEq/L (ref 135–145)
Total Bilirubin: 0.8 mg/dL (ref 0.2–1.2)
Total Protein: 7.2 g/dL (ref 6.0–8.3)

## 2018-04-08 LAB — LIPID PANEL
Cholesterol: 155 mg/dL (ref 0–200)
HDL: 38.2 mg/dL — ABNORMAL LOW (ref >39.00)
LDL Cholesterol: 101 mg/dL — ABNORMAL HIGH (ref 0–99)
NonHDL: 117.28
Total CHOL/HDL Ratio: 4
Triglycerides: 83 mg/dL (ref 0.0–149.0)
VLDL: 16.6 mg/dL (ref 0.0–40.0)

## 2018-08-03 ENCOUNTER — Other Ambulatory Visit: Payer: Self-pay | Admitting: Family Medicine

## 2018-08-03 DIAGNOSIS — G43009 Migraine without aura, not intractable, without status migrainosus: Secondary | ICD-10-CM

## 2019-04-09 ENCOUNTER — Encounter: Payer: 59 | Admitting: Family Medicine

## 2019-04-22 ENCOUNTER — Other Ambulatory Visit: Payer: Self-pay

## 2019-04-22 ENCOUNTER — Ambulatory Visit (INDEPENDENT_AMBULATORY_CARE_PROVIDER_SITE_OTHER): Payer: 59 | Admitting: Family Medicine

## 2019-04-22 ENCOUNTER — Encounter: Payer: Self-pay | Admitting: Family Medicine

## 2019-04-22 VITALS — BP 110/72 | HR 67 | Temp 98.5°F | Ht 74.0 in | Wt 196.4 lb

## 2019-04-22 DIAGNOSIS — Z0001 Encounter for general adult medical examination with abnormal findings: Secondary | ICD-10-CM | POA: Diagnosis not present

## 2019-04-22 DIAGNOSIS — R739 Hyperglycemia, unspecified: Secondary | ICD-10-CM | POA: Diagnosis not present

## 2019-04-22 DIAGNOSIS — G43009 Migraine without aura, not intractable, without status migrainosus: Secondary | ICD-10-CM

## 2019-04-22 DIAGNOSIS — Z6825 Body mass index (BMI) 25.0-25.9, adult: Secondary | ICD-10-CM

## 2019-04-22 DIAGNOSIS — E663 Overweight: Secondary | ICD-10-CM

## 2019-04-22 DIAGNOSIS — R7301 Impaired fasting glucose: Secondary | ICD-10-CM | POA: Insufficient documentation

## 2019-04-22 DIAGNOSIS — L209 Atopic dermatitis, unspecified: Secondary | ICD-10-CM

## 2019-04-22 DIAGNOSIS — E785 Hyperlipidemia, unspecified: Secondary | ICD-10-CM | POA: Diagnosis not present

## 2019-04-22 LAB — COMPREHENSIVE METABOLIC PANEL
ALT: 24 U/L (ref 0–53)
AST: 21 U/L (ref 0–37)
Albumin: 4.6 g/dL (ref 3.5–5.2)
Alkaline Phosphatase: 80 U/L (ref 39–117)
BUN: 10 mg/dL (ref 6–23)
CO2: 28 mEq/L (ref 19–32)
Calcium: 9.3 mg/dL (ref 8.4–10.5)
Chloride: 102 mEq/L (ref 96–112)
Creatinine, Ser: 0.97 mg/dL (ref 0.40–1.50)
GFR: 86.62 mL/min (ref >60.00)
Glucose, Bld: 87 mg/dL (ref 70–99)
Potassium: 4.2 mEq/L (ref 3.5–5.1)
Sodium: 138 mEq/L (ref 135–145)
Total Bilirubin: 0.7 mg/dL (ref 0.2–1.2)
Total Protein: 6.7 g/dL (ref 6.0–8.3)

## 2019-04-22 LAB — CBC
HCT: 46.2 % (ref 39.0–52.0)
Hemoglobin: 15.7 g/dL (ref 13.0–17.0)
MCHC: 34 g/dL (ref 30.0–36.0)
MCV: 85.7 fl (ref 78.0–100.0)
Platelets: 262 10*3/uL (ref 150.0–400.0)
RBC: 5.4 Mil/uL (ref 4.22–5.81)
RDW: 12.9 % (ref 11.5–15.5)
WBC: 5.5 10*3/uL (ref 4.0–10.5)

## 2019-04-22 LAB — TSH: TSH: 0.79 u[IU]/mL (ref 0.35–4.50)

## 2019-04-22 LAB — LIPID PANEL
Cholesterol: 169 mg/dL (ref 0–200)
HDL: 34.1 mg/dL — ABNORMAL LOW (ref >39.00)
LDL Cholesterol: 113 mg/dL — ABNORMAL HIGH (ref 0–99)
NonHDL: 135.23
Total CHOL/HDL Ratio: 5
Triglycerides: 113 mg/dL (ref 0.0–149.0)
VLDL: 22.6 mg/dL (ref 0.0–40.0)

## 2019-04-22 LAB — HEMOGLOBIN A1C: Hgb A1c MFr Bld: 5.2 % (ref 4.6–6.5)

## 2019-04-22 NOTE — Assessment & Plan Note (Signed)
Check A1c. 

## 2019-04-22 NOTE — Progress Notes (Signed)
Chief Complaint:  Charles Mccormick is a 38 y.o. male who presents today for his annual comprehensive physical exam and to transfer care.   Assessment/Plan:  Hyperglycemia Check A1c.  Dyslipidemia Check lipid panel, CBC, C met, and TSH.  Atopic dermatitis, mild Continue Protopic and triamcinolone as needed.  Migraine without aura and without status migrainosus, not intractable Continue Imitrex as needed.  Left Hip Pain Continue management per orthopedics.  Body mass index is 25.21 kg/m. / Overweight BMI Metric Follow Up - 04/22/19 0843      BMI Metric Follow Up-Please document annually   BMI Metric Follow Up  Education provided        Preventative Healthcare: Flu vaccine declined today.   Patient Counseling(The following topics were reviewed and/or handout was given):  -Nutrition: Stressed importance of moderation in sodium/caffeine intake, saturated fat and cholesterol, caloric balance, sufficient intake of fresh fruits, vegetables, and fiber.  -Stressed the importance of regular exercise.   -Substance Abuse: Discussed cessation/primary prevention of tobacco, alcohol, or other drug use; driving or other dangerous activities under the influence; availability of treatment for abuse.   -Injury prevention: Discussed safety belts, safety helmets, smoke detector, smoking near bedding or upholstery.   -Sexuality: Discussed sexually transmitted diseases, partner selection, use of condoms, avoidance of unintended pregnancy and contraceptive alternatives.   -Dental health: Discussed importance of regular tooth brushing, flossing, and dental visits.  -Health maintenance and immunizations reviewed. Please refer to Health maintenance section.  Return to care in 1 year for next preventative visit.     Subjective:  HPI:  He has no acute complaints today.   He is currently seeing [facility] for hip pain. This started a few months ago. Stable over that time. Had MRI done  yesteryda.   His stable, chronic medical conditions are outlined below:   # Atopic Dermatitis - On protopic twice daily as needed and triamcinolone 0.025% twice daily as needed  # Migraines - Takex imitrex 158m as needed  Lifestyle Diet: No specific diets or eating  Exercise: Limited due to hip pain. Likes to walk. Used to run.   [facility]  Decreased Interest 0  Down, Depressed, Hopeless 0  PHQ - 2 Score 0   ROS: Per HPI, otherwise a complete review of systems was negative.   PMH:  The following were reviewed and entered/updated in epic: Past Medical History:  Diagnosis Date  . Migraines    Patient Active Problem List   Diagnosis Date Noted  . Dyslipidemia 04/22/2019  . Hyperglycemia 04/22/2019  . Atopic dermatitis, mild 04/08/2018  . Migraine without aura and without status migrainosus, not intractable 07/19/2017   History reviewed. No pertinent surgical history.  Family History  Problem Relation Age of Onset  . Breast cancer Mother     Medications- reviewed and updated Current Outpatient Medications  Medication Sig Dispense Refill  . SUMAtriptan (IMITREX) 100 MG tablet TAKE 1 TABLET BYMOUTH FOR MIGRAINE MAY REPEAT IN 2 HOURS IF HEADACHE PERSISTS OR RECURS 10 tablet 12  . tacrolimus (PROTOPIC) 0.03 % ointment Apply topically 2 (two) times daily. 100 g 0  . triamcinolone (KENALOG) 0.025 % ointment Apply 1 application topically 2 (two) times daily. 30 g 0   No current facility-administered medications for this visit.     Allergies-reviewed and updated No Known Allergies  Social History   Socioeconomic History  . Marital status: Married    Spouse name: Not on file  . Number of children: Not on  file  . Years of education: Not on file  . Highest education level: Not on file  Occupational History  . Not on file  Social Needs  . Financial resource strain: Not on file  . Food insecurity    Worry: Not on file    Inability: Not  on file  . Transportation needs    Medical: Not on file    Non-medical: Not on file  Tobacco Use  . Smoking status: Never Smoker  . Smokeless tobacco: Never Used  Substance and Sexual Activity  . Alcohol use: Yes    Comment: occasionally  . Drug use: No  . Sexual activity: Yes    Partners: Female  Lifestyle  . Physical activity    Days per week: Not on file    Minutes per session: Not on file  . Stress: Not on file  Relationships  . Social Herbalist on phone: Not on file    Gets together: Not on file    Attends religious service: Not on file    Active member of club or organization: Not on file    Attends meetings of clubs or organizations: Not on file    Relationship status: Not on file  Other Topics Concern  . Not on file  Social History Narrative  . Not on file        Objective:  Physical Exam: BP 110/72   Pulse 67   Temp 98.5 F (36.9 C)   Ht '6\' 2"'  (1.88 m)   Wt 196 lb 6.1 oz (89.1 kg)   SpO2 98%   BMI 25.21 kg/m   Body mass index is 25.21 kg/m. Wt Readings from Last 3 Encounters:  04/22/19 196 lb 6.1 oz (89.1 kg)  04/08/18 199 lb (90.3 kg)  08/19/17 190 lb 9.6 oz (86.5 kg)   Gen: NAD, resting comfortably HEENT: TMs normal bilaterally. OP clear. No thyromegaly noted.  CV: RRR with no murmurs appreciated Pulm: NWOB, CTAB with no crackles, wheezes, or rhonchi GI: Normal bowel sounds present. Soft, Nontender, Nondistended. MSK: no edema, cyanosis, or clubbing noted Skin: warm, dry Neuro: CN2-12 grossly intact. Strength 5/5 in upper and lower extremities. Reflexes symmetric and intact bilaterally.  Psych: Normal affect and thought content     Zimal Weisensel M. Jerline Pain, MD 04/22/2019 8:44 AM

## 2019-04-22 NOTE — Patient Instructions (Signed)
It was very nice to see you today!  Keep up the good work!  We will check blood work today.  Come back to see me in 1 year for your next physical, or sooner if needed.  Take care, Dr Jerline Pain  Please try these tips to maintain a healthy lifestyle:   Eat at least 3 REAL meals and 1-2 snacks per day.  Aim for no more than 5 hours between eating.  If you eat breakfast, please do so within one hour of getting up.    Obtain twice as many fruits/vegetables as protein or carbohydrate foods for both lunch and dinner. (Half of each meal should be fruits/vegetables, one quarter protein, and one quarter starchy carbs)   Cut down on sweet beverages. This includes juice, soda, and sweet tea.    Exercise at least 150 minutes every week.    Preventive Care 38-41 Years Old, Male Preventive care refers to lifestyle choices and visits with your health care provider that can promote health and wellness. This includes:  A yearly physical exam. This is also called an annual well check.  Regular dental and eye exams.  Immunizations.  Screening for certain conditions.  Healthy lifestyle choices, such as eating a healthy diet, getting regular exercise, not using drugs or products that contain nicotine and tobacco, and limiting alcohol use. What can I expect for my preventive care visit? Physical exam Your health care provider will check:  Height and weight. These may be used to calculate body mass index (BMI), which is a measurement that tells if you are at a healthy weight.  Heart rate and blood pressure.  Your skin for abnormal spots. Counseling Your health care provider may ask you questions about:  Alcohol, tobacco, and drug use.  Emotional well-being.  Home and relationship well-being.  Sexual activity.  Eating habits.  Work and work Statistician. What immunizations do I need?  Influenza (flu) vaccine  This is recommended every year. Tetanus, diphtheria, and pertussis  (Tdap) vaccine  You may need a Td booster every 10 years. Varicella (chickenpox) vaccine  You may need this vaccine if you have not already been vaccinated. Human papillomavirus (HPV) vaccine  If recommended by your health care provider, you may need three doses over 6 months. Measles, mumps, and rubella (MMR) vaccine  You may need at least one dose of MMR. You may also need a second dose. Meningococcal conjugate (MenACWY) vaccine  One dose is recommended if you are 24-77 years old and a Market researcher living in a residence hall, or if you have one of several medical conditions. You may also need additional booster doses. Pneumococcal conjugate (PCV13) vaccine  You may need this if you have certain conditions and were not previously vaccinated. Pneumococcal polysaccharide (PPSV23) vaccine  You may need one or two doses if you smoke cigarettes or if you have certain conditions. Hepatitis A vaccine  You may need this if you have certain conditions or if you travel or work in places where you may be exposed to hepatitis A. Hepatitis B vaccine  You may need this if you have certain conditions or if you travel or work in places where you may be exposed to hepatitis B. Haemophilus influenzae type b (Hib) vaccine  You may need this if you have certain risk factors. You may receive vaccines as individual doses or as more than one vaccine together in one shot (combination vaccines). Talk with your health care provider about the risks and benefits of  combination vaccines. What tests do I need? Blood tests  Lipid and cholesterol levels. These may be checked every 5 years starting at age 72.  Hepatitis C test.  Hepatitis B test. Screening   Diabetes screening. This is done by checking your blood sugar (glucose) after you have not eaten for a while (fasting).  Sexually transmitted disease (STD) testing. Talk with your health care provider about your test results, treatment  options, and if necessary, the need for more tests. Follow these instructions at home: Eating and drinking   Eat a diet that includes fresh fruits and vegetables, whole grains, lean protein, and low-fat dairy products.  Take vitamin and mineral supplements as recommended by your health care provider.  Do not drink alcohol if your health care provider tells you not to drink.  If you drink alcohol: ? Limit how much you have to 0-2 drinks a day. ? Be aware of how much alcohol is in your drink. In the [place] one drink equals one 12 oz bottle of beer (355 mL), one 5 oz glass of wine (148 mL), or one 1 oz glass of hard liquor (44 mL). Lifestyle  Take daily care of your teeth and gums.  Stay active. Exercise for at least 30 minutes on 5 or more days each week.  Do not use any products that contain nicotine or tobacco, such as cigarettes, e-cigarettes, and chewing tobacco. If you need help quitting, ask your health care provider.  If you are sexually active, practice safe sex. Use a condom or other form of protection to prevent STIs (sexually transmitted infections). What's next?  Go to your health care provider once a year for a well check visit.  Ask your health care provider how often you should have your eyes and teeth checked.  Stay up to date on all vaccines. This information is not intended to replace advice given to you by your health care provider. Make sure you discuss any questions you have with your health care provider. Document Released: 08/13/2001 Document Revised: 06/11/2018 Document Reviewed: 06/11/2018 Elsevier Patient Education  2020 [employer].

## 2019-04-22 NOTE — Assessment & Plan Note (Signed)
Continue Protopic and triamcinolone as needed.

## 2019-04-22 NOTE — Assessment & Plan Note (Signed)
Check lipid panel, CBC, C met, and TSH. 

## 2019-04-22 NOTE — Progress Notes (Signed)
Please inform patient of the following:  Cholesterol levels are borderline elevated but all of his other labs are NORMAL. Do not need to start medications, recommend that he work on diet and exercise and we can recheck in a year or so.   Charles Mccormick. Jerline Pain, MD 04/22/2019 2:13 PM

## 2019-04-22 NOTE — Assessment & Plan Note (Signed)
Continue Imitrex as needed 

## 2019-07-13 DIAGNOSIS — M25552 Pain in left hip: Secondary | ICD-10-CM | POA: Diagnosis not present

## 2019-07-19 DIAGNOSIS — J069 Acute upper respiratory infection, unspecified: Secondary | ICD-10-CM | POA: Diagnosis not present

## 2019-07-19 DIAGNOSIS — Z20822 Contact with and (suspected) exposure to covid-19: Secondary | ICD-10-CM | POA: Diagnosis not present

## 2019-08-02 DIAGNOSIS — M25552 Pain in left hip: Secondary | ICD-10-CM | POA: Diagnosis not present

## 2019-08-20 DIAGNOSIS — M76892 Other specified enthesopathies of left lower limb, excluding foot: Secondary | ICD-10-CM | POA: Diagnosis not present

## 2019-09-01 ENCOUNTER — Other Ambulatory Visit: Payer: Self-pay | Admitting: Surgery

## 2019-09-01 DIAGNOSIS — R1030 Lower abdominal pain, unspecified: Secondary | ICD-10-CM

## 2019-12-03 DIAGNOSIS — M24152 Other articular cartilage disorders, left hip: Secondary | ICD-10-CM | POA: Diagnosis not present

## 2019-12-03 DIAGNOSIS — Z20822 Contact with and (suspected) exposure to covid-19: Secondary | ICD-10-CM | POA: Diagnosis not present

## 2019-12-03 DIAGNOSIS — M76892 Other specified enthesopathies of left lower limb, excluding foot: Secondary | ICD-10-CM | POA: Diagnosis not present

## 2019-12-06 DIAGNOSIS — M25852 Other specified joint disorders, left hip: Secondary | ICD-10-CM | POA: Diagnosis not present

## 2019-12-06 DIAGNOSIS — M25859 Other specified joint disorders, unspecified hip: Secondary | ICD-10-CM | POA: Diagnosis not present

## 2019-12-06 DIAGNOSIS — M76892 Other specified enthesopathies of left lower limb, excluding foot: Secondary | ICD-10-CM | POA: Insufficient documentation

## 2019-12-06 DIAGNOSIS — Z87891 Personal history of nicotine dependence: Secondary | ICD-10-CM | POA: Diagnosis not present

## 2019-12-06 DIAGNOSIS — M25552 Pain in left hip: Secondary | ICD-10-CM | POA: Diagnosis not present

## 2019-12-06 DIAGNOSIS — M2580 Other specified joint disorders, unspecified joint: Secondary | ICD-10-CM | POA: Diagnosis not present

## 2019-12-06 DIAGNOSIS — M24152 Other articular cartilage disorders, left hip: Secondary | ICD-10-CM | POA: Diagnosis not present

## 2019-12-07 DIAGNOSIS — Z87891 Personal history of nicotine dependence: Secondary | ICD-10-CM | POA: Diagnosis not present

## 2019-12-07 DIAGNOSIS — M25552 Pain in left hip: Secondary | ICD-10-CM | POA: Diagnosis not present

## 2019-12-07 DIAGNOSIS — M24152 Other articular cartilage disorders, left hip: Secondary | ICD-10-CM | POA: Diagnosis not present

## 2019-12-07 DIAGNOSIS — M25852 Other specified joint disorders, left hip: Secondary | ICD-10-CM | POA: Diagnosis not present

## 2019-12-07 DIAGNOSIS — I9789 Other postprocedural complications and disorders of the circulatory system, not elsewhere classified: Secondary | ICD-10-CM | POA: Diagnosis not present

## 2019-12-07 DIAGNOSIS — M1612 Unilateral primary osteoarthritis, left hip: Secondary | ICD-10-CM | POA: Diagnosis not present

## 2019-12-07 DIAGNOSIS — M2580 Other specified joint disorders, unspecified joint: Secondary | ICD-10-CM | POA: Diagnosis not present

## 2019-12-07 DIAGNOSIS — M25859 Other specified joint disorders, unspecified hip: Secondary | ICD-10-CM | POA: Diagnosis not present

## 2019-12-07 DIAGNOSIS — M76892 Other specified enthesopathies of left lower limb, excluding foot: Secondary | ICD-10-CM | POA: Diagnosis not present

## 2019-12-08 DIAGNOSIS — I9789 Other postprocedural complications and disorders of the circulatory system, not elsewhere classified: Secondary | ICD-10-CM | POA: Diagnosis not present

## 2019-12-08 DIAGNOSIS — M1612 Unilateral primary osteoarthritis, left hip: Secondary | ICD-10-CM | POA: Diagnosis not present

## 2019-12-09 DIAGNOSIS — M1612 Unilateral primary osteoarthritis, left hip: Secondary | ICD-10-CM | POA: Diagnosis not present

## 2019-12-09 DIAGNOSIS — I9789 Other postprocedural complications and disorders of the circulatory system, not elsewhere classified: Secondary | ICD-10-CM | POA: Diagnosis not present

## 2019-12-10 DIAGNOSIS — M1612 Unilateral primary osteoarthritis, left hip: Secondary | ICD-10-CM | POA: Diagnosis not present

## 2019-12-10 DIAGNOSIS — I9789 Other postprocedural complications and disorders of the circulatory system, not elsewhere classified: Secondary | ICD-10-CM | POA: Diagnosis not present

## 2019-12-11 DIAGNOSIS — I9789 Other postprocedural complications and disorders of the circulatory system, not elsewhere classified: Secondary | ICD-10-CM | POA: Diagnosis not present

## 2019-12-11 DIAGNOSIS — M1612 Unilateral primary osteoarthritis, left hip: Secondary | ICD-10-CM | POA: Diagnosis not present

## 2019-12-12 DIAGNOSIS — I9789 Other postprocedural complications and disorders of the circulatory system, not elsewhere classified: Secondary | ICD-10-CM | POA: Diagnosis not present

## 2019-12-12 DIAGNOSIS — M1612 Unilateral primary osteoarthritis, left hip: Secondary | ICD-10-CM | POA: Diagnosis not present

## 2019-12-13 DIAGNOSIS — M25552 Pain in left hip: Secondary | ICD-10-CM | POA: Diagnosis not present

## 2019-12-13 DIAGNOSIS — I9789 Other postprocedural complications and disorders of the circulatory system, not elsewhere classified: Secondary | ICD-10-CM | POA: Diagnosis not present

## 2019-12-13 DIAGNOSIS — M1612 Unilateral primary osteoarthritis, left hip: Secondary | ICD-10-CM | POA: Diagnosis not present

## 2019-12-14 DIAGNOSIS — M1612 Unilateral primary osteoarthritis, left hip: Secondary | ICD-10-CM | POA: Diagnosis not present

## 2019-12-14 DIAGNOSIS — I9789 Other postprocedural complications and disorders of the circulatory system, not elsewhere classified: Secondary | ICD-10-CM | POA: Diagnosis not present

## 2019-12-15 DIAGNOSIS — M1612 Unilateral primary osteoarthritis, left hip: Secondary | ICD-10-CM | POA: Diagnosis not present

## 2019-12-15 DIAGNOSIS — I9789 Other postprocedural complications and disorders of the circulatory system, not elsewhere classified: Secondary | ICD-10-CM | POA: Diagnosis not present

## 2019-12-16 DIAGNOSIS — M1612 Unilateral primary osteoarthritis, left hip: Secondary | ICD-10-CM | POA: Diagnosis not present

## 2019-12-16 DIAGNOSIS — I9789 Other postprocedural complications and disorders of the circulatory system, not elsewhere classified: Secondary | ICD-10-CM | POA: Diagnosis not present

## 2019-12-17 DIAGNOSIS — I9789 Other postprocedural complications and disorders of the circulatory system, not elsewhere classified: Secondary | ICD-10-CM | POA: Diagnosis not present

## 2019-12-17 DIAGNOSIS — M1612 Unilateral primary osteoarthritis, left hip: Secondary | ICD-10-CM | POA: Diagnosis not present

## 2019-12-18 DIAGNOSIS — I9789 Other postprocedural complications and disorders of the circulatory system, not elsewhere classified: Secondary | ICD-10-CM | POA: Diagnosis not present

## 2019-12-18 DIAGNOSIS — M1612 Unilateral primary osteoarthritis, left hip: Secondary | ICD-10-CM | POA: Diagnosis not present

## 2019-12-18 DIAGNOSIS — M25552 Pain in left hip: Secondary | ICD-10-CM | POA: Diagnosis not present

## 2019-12-19 DIAGNOSIS — M1612 Unilateral primary osteoarthritis, left hip: Secondary | ICD-10-CM | POA: Diagnosis not present

## 2019-12-19 DIAGNOSIS — I9789 Other postprocedural complications and disorders of the circulatory system, not elsewhere classified: Secondary | ICD-10-CM | POA: Diagnosis not present

## 2019-12-20 DIAGNOSIS — M1612 Unilateral primary osteoarthritis, left hip: Secondary | ICD-10-CM | POA: Diagnosis not present

## 2019-12-20 DIAGNOSIS — I9789 Other postprocedural complications and disorders of the circulatory system, not elsewhere classified: Secondary | ICD-10-CM | POA: Diagnosis not present

## 2019-12-21 DIAGNOSIS — M1612 Unilateral primary osteoarthritis, left hip: Secondary | ICD-10-CM | POA: Diagnosis not present

## 2019-12-21 DIAGNOSIS — I9789 Other postprocedural complications and disorders of the circulatory system, not elsewhere classified: Secondary | ICD-10-CM | POA: Diagnosis not present

## 2019-12-22 DIAGNOSIS — M25552 Pain in left hip: Secondary | ICD-10-CM | POA: Diagnosis not present

## 2019-12-22 DIAGNOSIS — I9789 Other postprocedural complications and disorders of the circulatory system, not elsewhere classified: Secondary | ICD-10-CM | POA: Diagnosis not present

## 2019-12-22 DIAGNOSIS — M1612 Unilateral primary osteoarthritis, left hip: Secondary | ICD-10-CM | POA: Diagnosis not present

## 2019-12-23 DIAGNOSIS — I9789 Other postprocedural complications and disorders of the circulatory system, not elsewhere classified: Secondary | ICD-10-CM | POA: Diagnosis not present

## 2019-12-23 DIAGNOSIS — M1612 Unilateral primary osteoarthritis, left hip: Secondary | ICD-10-CM | POA: Diagnosis not present

## 2019-12-24 DIAGNOSIS — I9789 Other postprocedural complications and disorders of the circulatory system, not elsewhere classified: Secondary | ICD-10-CM | POA: Diagnosis not present

## 2019-12-24 DIAGNOSIS — M1612 Unilateral primary osteoarthritis, left hip: Secondary | ICD-10-CM | POA: Diagnosis not present

## 2019-12-25 DIAGNOSIS — I9789 Other postprocedural complications and disorders of the circulatory system, not elsewhere classified: Secondary | ICD-10-CM | POA: Diagnosis not present

## 2019-12-25 DIAGNOSIS — M1612 Unilateral primary osteoarthritis, left hip: Secondary | ICD-10-CM | POA: Diagnosis not present

## 2019-12-26 DIAGNOSIS — M1612 Unilateral primary osteoarthritis, left hip: Secondary | ICD-10-CM | POA: Diagnosis not present

## 2019-12-26 DIAGNOSIS — I9789 Other postprocedural complications and disorders of the circulatory system, not elsewhere classified: Secondary | ICD-10-CM | POA: Diagnosis not present

## 2019-12-27 DIAGNOSIS — M1612 Unilateral primary osteoarthritis, left hip: Secondary | ICD-10-CM | POA: Diagnosis not present

## 2019-12-27 DIAGNOSIS — I9789 Other postprocedural complications and disorders of the circulatory system, not elsewhere classified: Secondary | ICD-10-CM | POA: Diagnosis not present

## 2019-12-28 DIAGNOSIS — I9789 Other postprocedural complications and disorders of the circulatory system, not elsewhere classified: Secondary | ICD-10-CM | POA: Diagnosis not present

## 2019-12-28 DIAGNOSIS — M1612 Unilateral primary osteoarthritis, left hip: Secondary | ICD-10-CM | POA: Diagnosis not present

## 2019-12-29 DIAGNOSIS — M25552 Pain in left hip: Secondary | ICD-10-CM | POA: Diagnosis not present

## 2020-01-06 DIAGNOSIS — M25552 Pain in left hip: Secondary | ICD-10-CM | POA: Diagnosis not present

## 2020-01-11 DIAGNOSIS — M25552 Pain in left hip: Secondary | ICD-10-CM | POA: Diagnosis not present

## 2020-01-14 DIAGNOSIS — M25552 Pain in left hip: Secondary | ICD-10-CM | POA: Diagnosis not present

## 2020-01-18 DIAGNOSIS — M25552 Pain in left hip: Secondary | ICD-10-CM | POA: Diagnosis not present

## 2020-01-25 DIAGNOSIS — M25552 Pain in left hip: Secondary | ICD-10-CM | POA: Diagnosis not present

## 2020-01-27 DIAGNOSIS — M25552 Pain in left hip: Secondary | ICD-10-CM | POA: Diagnosis not present

## 2020-02-01 DIAGNOSIS — M25552 Pain in left hip: Secondary | ICD-10-CM | POA: Diagnosis not present

## 2020-02-03 DIAGNOSIS — M25552 Pain in left hip: Secondary | ICD-10-CM | POA: Diagnosis not present

## 2020-02-08 DIAGNOSIS — M25552 Pain in left hip: Secondary | ICD-10-CM | POA: Diagnosis not present

## 2020-02-18 DIAGNOSIS — M25552 Pain in left hip: Secondary | ICD-10-CM | POA: Diagnosis not present

## 2020-04-21 DIAGNOSIS — M76892 Other specified enthesopathies of left lower limb, excluding foot: Secondary | ICD-10-CM | POA: Diagnosis not present

## 2020-04-24 ENCOUNTER — Ambulatory Visit (INDEPENDENT_AMBULATORY_CARE_PROVIDER_SITE_OTHER): Payer: BC Managed Care – PPO | Admitting: Family Medicine

## 2020-04-24 ENCOUNTER — Encounter: Payer: Self-pay | Admitting: Family Medicine

## 2020-04-24 ENCOUNTER — Other Ambulatory Visit: Payer: Self-pay

## 2020-04-24 VITALS — BP 119/78 | HR 73 | Temp 98.4°F | Ht 74.0 in | Wt 202.6 lb

## 2020-04-24 DIAGNOSIS — E785 Hyperlipidemia, unspecified: Secondary | ICD-10-CM | POA: Diagnosis not present

## 2020-04-24 DIAGNOSIS — R739 Hyperglycemia, unspecified: Secondary | ICD-10-CM

## 2020-04-24 DIAGNOSIS — G43009 Migraine without aura, not intractable, without status migrainosus: Secondary | ICD-10-CM

## 2020-04-24 DIAGNOSIS — L209 Atopic dermatitis, unspecified: Secondary | ICD-10-CM

## 2020-04-24 DIAGNOSIS — Z0001 Encounter for general adult medical examination with abnormal findings: Secondary | ICD-10-CM

## 2020-04-24 DIAGNOSIS — M1612 Unilateral primary osteoarthritis, left hip: Secondary | ICD-10-CM | POA: Insufficient documentation

## 2020-04-24 MED ORDER — TRIAMCINOLONE ACETONIDE 0.025 % EX OINT: 1.0000 "application " | TOPICAL_OINTMENT | Freq: Two times a day (BID) | CUTANEOUS | 0 refills | Status: AC

## 2020-04-24 MED ORDER — SUMATRIPTAN SUCCINATE 100 MG PO TABS: ORAL_TABLET | ORAL | 12 refills | Status: DC

## 2020-04-24 NOTE — Patient Instructions (Signed)
It was very nice to see you today!  I will refill your meds today.  We will check blood work today.  I will see you back in a year please come back to see me sooner if needed  Take care, Dr Jerline Pain  Please try these tips to maintain a healthy lifestyle:   Eat at least 3 REAL meals and 1-2 snacks per day.  Aim for no more than 5 hours between eating.  If you eat breakfast, please do so within one hour of getting up.    Each meal should contain half fruits/vegetables, one quarter protein, and one quarter carbs (no bigger than a computer mouse)   Cut down on sweet beverages. This includes juice, soda, and sweet tea.     Drink at least 1 glass of water with each meal and aim for at least 8 glasses per day   Exercise at least 150 minutes every week.    Preventive Care 49-80 Years Old, Male Preventive care refers to lifestyle choices and visits with your health care provider that can promote health and wellness. This includes:  A yearly physical exam. This is also called an annual well check.  Regular dental and eye exams.  Immunizations.  Screening for certain conditions.  Healthy lifestyle choices, such as eating a healthy diet, getting regular exercise, not using drugs or products that contain nicotine and tobacco, and limiting alcohol use. What can I expect for my preventive care visit? Physical exam Your health care provider will check:  Height and weight. These may be used to calculate body mass index (BMI), which is a measurement that tells if you are at a healthy weight.  Heart rate and blood pressure.  Your skin for abnormal spots. Counseling Your health care provider may ask you questions about:  Alcohol, tobacco, and drug use.  Emotional well-being.  Home and relationship well-being.  Sexual activity.  Eating habits.  Work and work Statistician. What immunizations do I need?  Influenza (flu) vaccine  This is recommended every year. Tetanus,  diphtheria, and pertussis (Tdap) vaccine  You may need a Td booster every 10 years. Varicella (chickenpox) vaccine  You may need this vaccine if you have not already been vaccinated. Human papillomavirus (HPV) vaccine  If recommended by your health care provider, you may need three doses over 6 months. Measles, mumps, and rubella (MMR) vaccine  You may need at least one dose of MMR. You may also need a second dose. Meningococcal conjugate (MenACWY) vaccine  One dose is recommended if you are 58-76 years old and a Market researcher living in a residence hall, or if you have one of several medical conditions. You may also need additional booster doses. Pneumococcal conjugate (PCV13) vaccine  You may need this if you have certain conditions and were not previously vaccinated. Pneumococcal polysaccharide (PPSV23) vaccine  You may need one or two doses if you smoke cigarettes or if you have certain conditions. Hepatitis A vaccine  You may need this if you have certain conditions or if you travel or work in places where you may be exposed to hepatitis A. Hepatitis B vaccine  You may need this if you have certain conditions or if you travel or work in places where you may be exposed to hepatitis B. Haemophilus influenzae type b (Hib) vaccine  You may need this if you have certain risk factors. You may receive vaccines as individual doses or as more than one vaccine together in one shot (combination  vaccines). Talk with your health care provider about the risks and benefits of combination vaccines. What tests do I need? Blood tests  Lipid and cholesterol levels. These may be checked every 5 years starting at age 20.  Hepatitis C test.  Hepatitis B test. Screening   Diabetes screening. This is done by checking your blood sugar (glucose) after you have not eaten for a while (fasting).  Sexually transmitted disease (STD) testing. Talk with your health care provider about  your test results, treatment options, and if necessary, the need for more tests. Follow these instructions at home: Eating and drinking   Eat a diet that includes fresh fruits and vegetables, whole grains, lean protein, and low-fat dairy products.  Take vitamin and mineral supplements as recommended by your health care provider.  Do not drink alcohol if your health care provider tells you not to drink.  If you drink alcohol: ? Limit how much you have to 0-2 drinks a day. ? Be aware of how much alcohol is in your drink. In the [place] one drink equals one 12 oz bottle of beer (355 mL), one 5 oz glass of wine (148 mL), or one 1 oz glass of hard liquor (44 mL). Lifestyle  Take daily care of your teeth and gums.  Stay active. Exercise for at least 30 minutes on 5 or more days each week.  Do not use any products that contain nicotine or tobacco, such as cigarettes, e-cigarettes, and chewing tobacco. If you need help quitting, ask your health care provider.  If you are sexually active, practice safe sex. Use a condom or other form of protection to prevent STIs (sexually transmitted infections). What's next?  Go to your health care provider once a year for a well check visit.  Ask your health care provider how often you should have your eyes and teeth checked.  Stay up to date on all vaccines. This information is not intended to replace advice given to you by your health care provider. Make sure you discuss any questions you have with your health care provider. Document Revised: 06/11/2018 Document Reviewed: 06/11/2018 Elsevier Patient Education  2020 [employer]  

## 2020-04-24 NOTE — Progress Notes (Signed)
Chief Complaint:  Charles Mccormick is a 39 y.o. male who presents today for his annual comprehensive physical exam.    Assessment/Plan:  Chronic Problems Addressed Today: No problem-specific Assessment & Plan notes found for this encounter.  Preventative Healthcare:  Flu vaccine deferred.  Discussed Covid vaccine.  Will check labs including CBC, CMET, TSH, lipid panel.  Patient Counseling(The following topics were reviewed and/or handout was given):  -Nutrition: Stressed importance of moderation in sodium/caffeine intake, saturated fat and cholesterol, caloric balance, sufficient intake of fresh fruits, vegetables, and fiber.  -Stressed the importance of regular exercise.   -Substance Abuse: Discussed cessation/primary prevention of tobacco, alcohol, or other drug use; driving or other dangerous activities under the influence; availability of treatment for abuse.   -Injury prevention: Discussed safety belts, safety helmets, smoke detector, smoking near bedding or upholstery.   -Sexuality: Discussed sexually transmitted diseases, partner selection, use of condoms, avoidance of unintended pregnancy and contraceptive alternatives.   -Dental health: Discussed importance of regular tooth brushing, flossing, and dental visits.  -Health maintenance and immunizations reviewed. Please refer to Health maintenance section.  Return to care in 1 year for next preventative visit.     Subjective:  HPI:  He has no acute complaints today.   Lifestyle Diet: Balanced. Plenty of fruits and vegetables.  Exercise: Previously limited due to hip issues. Trying to get back into jogging.   Depression screen [facility] 2/9 04/24/2020  Decreased Interest 0  Down, Depressed, Hopeless 0  PHQ - 2 Score 0  Altered sleeping 0  Tired, decreased energy 0  Change in appetite 0  Feeling bad or failure about yourself  0  Trouble concentrating 0  Moving slowly or fidgety/restless 0  Suicidal thoughts 0  PHQ-9 Score  0  Difficult doing work/chores Not difficult at all    Health Maintenance Due  Topic Date Due  . Hepatitis C Screening  Never done     ROS: Per HPI, otherwise a complete review of systems was negative.   PMH:  The following were reviewed and entered/updated in epic: Past Medical History:  Diagnosis Date  . Migraines    Patient Active Problem List   Diagnosis Date Noted  . Degenerative joint disease of left hip 04/24/2020  . Enthesopathy of left hip region 12/06/2019  . Dyslipidemia 04/22/2019  . Hyperglycemia 04/22/2019  . IFG (impaired fasting glucose) 04/22/2019  . Atopic dermatitis, mild 04/08/2018  . Migraine without aura and without status migrainosus, not intractable 07/19/2017  . Common migraine 07/19/2017   History reviewed. No pertinent surgical history.  Family History  Problem Relation Age of Onset  . Breast cancer Mother   . Thyroid disease Sister   . Stroke Paternal Grandfather     Medications- reviewed and updated Current Outpatient Medications  Medication Sig Dispense Refill  . SUMAtriptan (IMITREX) 100 MG tablet TAKE 1 TABLET BYMOUTH FOR MIGRAINE MAY REPEAT IN 2 HOURS IF HEADACHE PERSISTS OR RECURS 10 tablet 12  . tacrolimus (PROTOPIC) 0.03 % ointment Apply topically 2 (two) times daily. 100 g 0  . triamcinolone (KENALOG) 0.025 % ointment Apply 1 application topically 2 (two) times daily. 30 g 0   No current facility-administered medications for this visit.    Allergies-reviewed and updated No Known Allergies  Social History   Socioeconomic History  . Marital status: Married    Spouse name: Not on file  . Number of children: Not on file  . Years of education: Not on file  . Highest education  level: Not on file  Occupational History  . Not on file  Tobacco Use  . Smoking status: Never Smoker  . Smokeless tobacco: Never Used  Substance and Sexual Activity  . Alcohol use: Yes    Comment: occasionally  . Drug use: No  . Sexual activity:  Yes    Partners: Female  Other Topics Concern  . Not on file  Social History Narrative  . Not on file   Social Determinants of Health   Financial Resource Strain:   . Difficulty of Paying Living Expenses: Not on file  Food Insecurity:   . Worried About Programme researcher, broadcasting/film/video in the Last Year: Not on file  . Ran Out of Food in the Last Year: Not on file  Transportation Needs:   . Lack of Transportation (Medical): Not on file  . Lack of Transportation (Non-Medical): Not on file  Physical Activity:   . Days of Exercise per Week: Not on file  . Minutes of Exercise per Session: Not on file  Stress:   . Feeling of Stress : Not on file  Social Connections:   . Frequency of Communication with Friends and Family: Not on file  . Frequency of Social Gatherings with Friends and Family: Not on file  . Attends Religious Services: Not on file  . Active Member of Clubs or Organizations: Not on file  . Attends Banker Meetings: Not on file  . Marital Status: Not on file        Objective:  Physical Exam: BP 119/78   Pulse 73   Temp 98.4 F (36.9 C) (Temporal)   Ht 6\' 2"  (1.88 m)   Wt 202 lb 9.6 oz (91.9 kg)   SpO2 98%   BMI 26.01 kg/m   Body mass index is 26.01 kg/m. Wt Readings from Last 3 Encounters:  04/24/20 202 lb 9.6 oz (91.9 kg)  04/22/19 196 lb 6.1 oz (89.1 kg)  04/08/18 199 lb (90.3 kg)   Gen: NAD, resting comfortably HEENT: TMs normal bilaterally. OP clear. No thyromegaly noted.  CV: RRR with no murmurs appreciated Pulm: NWOB, CTAB with no crackles, wheezes, or rhonchi GI: Normal bowel sounds present. Soft, Nontender, Nondistended. MSK: no edema, cyanosis, or clubbing noted Skin: warm, dry Neuro: CN2-12 grossly intact. Strength 5/5 in upper and lower extremities. Reflexes symmetric and intact bilaterally.  Psych: Normal affect and thought content     Charles Mccormick M. 06/08/18, MD 04/24/2020 8:24 AM

## 2020-04-24 NOTE — Assessment & Plan Note (Signed)
Stable.  Will refill Imitrex today.

## 2020-04-24 NOTE — Assessment & Plan Note (Signed)
Check A1c. 

## 2020-04-24 NOTE — Assessment & Plan Note (Signed)
Refill triamcinolone.  He also continue tacrolimus.

## 2020-04-24 NOTE — Assessment & Plan Note (Signed)
Check lipid panel.  Discussed lifestyle modifications. °

## 2020-04-25 LAB — HEMOGLOBIN A1C
Hgb A1c MFr Bld: 5.3 % of total Hgb (ref <5.7)
Mean Plasma Glucose: 105 (calc)

## 2020-04-25 LAB — COMPREHENSIVE METABOLIC PANEL
AG Ratio: 2.1 (calc) (ref 1.0–2.5)
ALT: 42 U/L (ref 9–46)
Alkaline phosphatase (APISO): 78 U/L (ref 36–130)
BUN: 11 mg/dL (ref 7–25)

## 2020-04-25 LAB — CBC: MCH: 28.9 pg (ref 27.0–33.0)

## 2020-04-26 LAB — CBC
HCT: 49 % (ref 38.5–50.0)
Hemoglobin: 16.5 g/dL (ref 13.2–17.1)
MCHC: 33.7 g/dL (ref 32.0–36.0)
MCV: 85.8 fL (ref 80.0–100.0)
MPV: 10.3 fL (ref 7.5–12.5)
Platelets: 258 10*3/uL (ref 140–400)
RBC: 5.71 10*6/uL (ref 4.20–5.80)
RDW: 11.9 % (ref 11.0–15.0)
WBC: 5.9 10*3/uL (ref 3.8–10.8)

## 2020-04-26 LAB — COMPREHENSIVE METABOLIC PANEL
AST: 25 U/L (ref 10–40)
Albumin: 4.8 g/dL (ref 3.6–5.1)
CO2: 27 mmol/L (ref 20–32)
Calcium: 9.7 mg/dL (ref 8.6–10.3)
Chloride: 102 mmol/L (ref 98–110)
Creat: 0.99 mg/dL (ref 0.60–1.35)
Globulin: 2.3 g/dL (calc) (ref 1.9–3.7)
Glucose, Bld: 98 mg/dL (ref 65–99)
Potassium: 4.3 mmol/L (ref 3.5–5.3)
Sodium: 138 mmol/L (ref 135–146)
Total Bilirubin: 0.7 mg/dL (ref 0.2–1.2)
Total Protein: 7.1 g/dL (ref 6.1–8.1)

## 2020-04-26 LAB — LIPID PANEL
Cholesterol: 187 mg/dL (ref <200)
HDL: 40 mg/dL (ref >=40)
LDL Cholesterol (Calc): 126 mg/dL (calc) — ABNORMAL HIGH
Non-HDL Cholesterol (Calc): 147 mg/dL (calc) — ABNORMAL HIGH (ref <130)
Total CHOL/HDL Ratio: 4.7 (calc) (ref <5.0)
Triglycerides: 99 mg/dL (ref <150)

## 2020-04-26 LAB — TSH: TSH: 1.17 mIU/L (ref 0.40–4.50)

## 2020-04-26 LAB — HEMOGLOBIN A1C: eAG (mmol/L): 5.8 (calc)

## 2020-04-26 NOTE — Progress Notes (Signed)
Please inform patient of the following:  His "bad" cholesterol a bit elevated but everything is STABLE. Do not need to make any changes to his treatment plan at this time. Would like for him to keep working on diet and exercise and we can recheck in a year.  Katina Degree. [name], MD 04/26/2020 11:20 AM

## 2021-04-25 ENCOUNTER — Encounter: Payer: BC Managed Care – PPO | Admitting: Family Medicine

## 2021-05-10 ENCOUNTER — Ambulatory Visit (INDEPENDENT_AMBULATORY_CARE_PROVIDER_SITE_OTHER): Payer: BC Managed Care – PPO | Admitting: Family Medicine

## 2021-05-10 ENCOUNTER — Encounter: Payer: Self-pay | Admitting: Family Medicine

## 2021-05-10 ENCOUNTER — Other Ambulatory Visit: Payer: Self-pay

## 2021-05-10 VITALS — BP 127/77 | HR 68 | Temp 97.7°F | Ht 74.0 in | Wt 204.4 lb

## 2021-05-10 DIAGNOSIS — G43009 Migraine without aura, not intractable, without status migrainosus: Secondary | ICD-10-CM

## 2021-05-10 DIAGNOSIS — E785 Hyperlipidemia, unspecified: Secondary | ICD-10-CM

## 2021-05-10 DIAGNOSIS — Z0001 Encounter for general adult medical examination with abnormal findings: Secondary | ICD-10-CM

## 2021-05-10 DIAGNOSIS — R739 Hyperglycemia, unspecified: Secondary | ICD-10-CM | POA: Diagnosis not present

## 2021-05-10 DIAGNOSIS — L209 Atopic dermatitis, unspecified: Secondary | ICD-10-CM

## 2021-05-10 LAB — COMPREHENSIVE METABOLIC PANEL
ALT: 42 U/L (ref 0–53)
AST: 25 U/L (ref 0–37)
Albumin: 4.9 g/dL (ref 3.5–5.2)
Alkaline Phosphatase: 80 U/L (ref 39–117)
BUN: 12 mg/dL (ref 6–23)
CO2: 29 mEq/L (ref 19–32)
Calcium: 9.6 mg/dL (ref 8.4–10.5)
Chloride: 100 mEq/L (ref 96–112)
Creatinine, Ser: 0.96 mg/dL (ref 0.40–1.50)
GFR: 99.2 mL/min (ref >60.00)
Glucose, Bld: 84 mg/dL (ref 70–99)
Potassium: 4.3 mEq/L (ref 3.5–5.1)
Sodium: 137 mEq/L (ref 135–145)
Total Bilirubin: 0.8 mg/dL (ref 0.2–1.2)
Total Protein: 7.4 g/dL (ref 6.0–8.3)

## 2021-05-10 LAB — CBC
HCT: 48.9 % (ref 39.0–52.0)
Hemoglobin: 16.4 g/dL (ref 13.0–17.0)
MCHC: 33.5 g/dL (ref 30.0–36.0)
MCV: 86.4 fl (ref 78.0–100.0)
Platelets: 269 10*3/uL (ref 150.0–400.0)
RBC: 5.66 Mil/uL (ref 4.22–5.81)
RDW: 13.2 % (ref 11.5–15.5)
WBC: 6.1 10*3/uL (ref 4.0–10.5)

## 2021-05-10 LAB — LIPID PANEL
Cholesterol: 187 mg/dL (ref 0–200)
HDL: 42 mg/dL (ref >39.00)
LDL Cholesterol: 120 mg/dL — ABNORMAL HIGH (ref 0–99)
NonHDL: 145.42
Total CHOL/HDL Ratio: 4
Triglycerides: 125 mg/dL (ref 0.0–149.0)
VLDL: 25 mg/dL (ref 0.0–40.0)

## 2021-05-10 LAB — TSH: TSH: 1.23 u[IU]/mL (ref 0.35–5.50)

## 2021-05-10 LAB — HEMOGLOBIN A1C: Hgb A1c MFr Bld: 5.5 % (ref 4.6–6.5)

## 2021-05-10 NOTE — Patient Instructions (Signed)
It was very nice to see you today!  We we will check blood work today.  Please continue working on diet and exercise.  I will see back in year.  Come back to see me sooner if needed.  Take care, Dr [name]  PLEASE NOTE:  If you had any lab tests please let us know if you have not heard back within a few days. You may see your results on mychart before we have a chance to review them but we will give you a call once they are reviewed by [place]. If we ordered any referrals today, please let us know if you have not heard from their office within the next week.   Please try these tips to maintain a healthy lifestyle:  Eat at least 3 REAL meals and 1-2 snacks per day.  Aim for no more than 5 hours between eating.  If you eat breakfast, please do so within one hour of getting up.   Each meal should contain half fruits/vegetables, one quarter protein, and one quarter carbs (no bigger than a computer mouse)  Cut down on sweet beverages. This includes juice, soda, and sweet tea.   Drink at least 1 glass of water with each meal and aim for at least 8 glasses per day  Exercise at least 150 minutes every week.    Preventive Care 87-40 Years Old, Male Preventive care refers to lifestyle choices and visits with your health care provider that can promote health and wellness. Preventive care visits are also called wellness exams. What can I expect for my preventive care visit? Counseling During your preventive care visit, your health care provider may ask about your: Medical history, including: Past medical problems. Family medical history. Current health, including: Emotional well-being. Home life and relationship well-being. Sexual activity. Lifestyle, including: Alcohol, nicotine or tobacco, and drug use. Access to firearms. Diet, exercise, and sleep habits. Safety issues such as seatbelt and bike helmet use. Sunscreen use. Work and work Astronomer. Physical exam Your health care  provider will check your: Height and weight. These may be used to calculate your BMI (body mass index). BMI is a measurement that tells if you are at a healthy weight. Waist circumference. This measures the distance around your waistline. This measurement also tells if you are at a healthy weight and may help predict your risk of certain diseases, such as type 2 diabetes and high blood pressure. Heart rate and blood pressure. Body temperature. Skin for abnormal spots. What immunizations do I need? Vaccines are usually given at various ages, according to a schedule. Your health care provider will recommend vaccines for you based on your age, medical history, and lifestyle or other factors, such as travel or where you work. What tests do I need? Screening Your health care provider may recommend screening tests for certain conditions. This may include: Lipid and cholesterol levels. Diabetes screening. This is done by checking your blood sugar (glucose) after you have not eaten for a while (fasting). Hepatitis B test. Hepatitis C test. HIV (human immunodeficiency virus) test. STI (sexually transmitted infection) testing, if you are at risk. Lung cancer screening. Prostate cancer screening. Colorectal cancer screening. Talk with your health care provider about your test results, treatment options, and if necessary, the need for more tests. Follow these instructions at home: Eating and drinking  Eat a diet that includes fresh fruits and vegetables, whole grains, lean protein, and low-fat dairy products. Take vitamin and mineral supplements as recommended by your health  care provider. Do not drink alcohol if your health care provider tells you not to drink. If you drink alcohol: Limit how much you have to 0-2 drinks a day. Know how much alcohol is in your drink. In the [place] one drink equals one 12 oz bottle of beer (355 mL), one 5 oz glass of wine (148 mL), or one 1 oz glass of hard liquor  (44 mL). Lifestyle Brush your teeth every morning and night with fluoride toothpaste. Floss one time each day. Exercise for at least 30 minutes 5 or more days each week. Do not use any products that contain nicotine or tobacco. These products include cigarettes, chewing tobacco, and vaping devices, such as e-cigarettes. If you need help quitting, ask your health care provider. Do not use drugs. If you are sexually active, practice safe sex. Use a condom or other form of protection to prevent STIs. Take aspirin only as told by your health care provider. Make sure that you understand how much to take and what form to take. Work with your health care provider to find out whether it is safe and beneficial for you to take aspirin daily. Find healthy ways to manage stress, such as: Meditation, yoga, or listening to music. Journaling. Talking to a trusted person. Spending time with friends and family. Minimize exposure to UV radiation to reduce your risk of skin cancer. Safety Always wear your seat belt while driving or riding in a vehicle. Do not drive: If you have been drinking alcohol. Do not ride with someone who has been drinking. When you are tired or distracted. While texting. If you have been using any mind-altering substances or drugs. Wear a helmet and other protective equipment during sports activities. If you have firearms in your house, make sure you follow all gun safety procedures. What's next? Go to your health care provider once a year for an annual wellness visit. Ask your health care provider how often you should have your eyes and teeth checked. Stay up to date on all vaccines. This information is not intended to replace advice given to you by your health care provider. Make sure you discuss any questions you have with your health care provider. Document Revised: 12/13/2020 Document Reviewed: 12/13/2020 Elsevier Patient Education  2022 [employer].

## 2021-05-10 NOTE — Assessment & Plan Note (Signed)
Manageable.  Uses Imitrex as needed.

## 2021-05-10 NOTE — Assessment & Plan Note (Signed)
Doing well on triamcinolone and Protopic as needed.  No recent flares.

## 2021-05-10 NOTE — Assessment & Plan Note (Signed)
Check labs 

## 2021-05-10 NOTE — Progress Notes (Signed)
Chief Complaint:  Charles Mccormick is a 40 y.o. male who presents today for his annual comprehensive physical exam.    Assessment/Plan:  Chronic Problems Addressed Today: Hyperglycemia Check A1c.    Dyslipidemia Check labs.  Atopic dermatitis, mild Doing well on triamcinolone and Protopic as needed.  No recent flares.  Migraine without aura and without status migrainosus, not intractable Manageable.  Uses Imitrex as needed.  Body mass index is 26.24 kg/m. / Overweight  BMI Metric Follow Up - 05/10/21 1033       BMI Metric Follow Up-Please document annually   BMI Metric Follow Up Education provided             Preventative Healthcare: Will get blood work today. Declined flu vaccine.  Patient Counseling(The following topics were reviewed and/or handout was given):  -Nutrition: Stressed importance of moderation in sodium/caffeine intake, saturated fat and cholesterol, caloric balance, sufficient intake of fresh fruits, vegetables, and fiber.  -Stressed the importance of regular exercise.   -Substance Abuse: Discussed cessation/primary prevention of tobacco, alcohol, or other drug use; driving or other dangerous activities under the influence; availability of treatment for abuse.   -Injury prevention: Discussed safety belts, safety helmets, smoke detector, smoking near bedding or upholstery.   -Sexuality: Discussed sexually transmitted diseases, partner selection, use of condoms, avoidance of unintended pregnancy and contraceptive alternatives.   -Dental health: Discussed importance of regular tooth brushing, flossing, and dental visits.  -Health maintenance and immunizations reviewed. Please refer to Health maintenance section.  Return to care in 1 year for next preventative visit.     Subjective:  HPI:  He has no acute complaints today. See A/p for status of chronic conditions.   Lifestyle Diet: Balanced. Plenty of fruits and vegetables. Exercise: Trying to get  back on exercise.  Depression screen PHQ 2/9 05/10/2021  Decreased Interest 0  Down, Depressed, Hopeless 0  PHQ - 2 Score 0  Altered sleeping -  Tired, decreased energy -  Change in appetite -  Feeling bad or failure about yourself  -  Trouble concentrating -  Moving slowly or fidgety/restless -  Suicidal thoughts -  PHQ-9 Score -  Difficult doing work/chores -    Health Maintenance Due  Topic Date Due   Hepatitis C Screening  Never done     ROS: Per HPI, otherwise a complete review of systems was negative.   PMH:  The following were reviewed and entered/updated in epic: Past Medical History:  Diagnosis Date   Migraines    Patient Active Problem List   Diagnosis Date Noted   Degenerative joint disease of left hip 04/24/2020   Enthesopathy of left hip region 12/06/2019   Dyslipidemia 04/22/2019   Hyperglycemia 04/22/2019   Atopic dermatitis, mild 04/08/2018   Migraine without aura and without status migrainosus, not intractable 07/19/2017   History reviewed. No pertinent surgical history.  Family History  Problem Relation Age of Onset   Breast cancer Mother    Thyroid disease Sister    Stroke Paternal Grandfather     Medications- reviewed and updated Current Outpatient Medications  Medication Sig Dispense Refill   SUMAtriptan (IMITREX) 100 MG tablet TAKE 1 TABLET BYMOUTH FOR MIGRAINE MAY REPEAT IN 2 HOURS IF HEADACHE PERSISTS OR RECURS 10 tablet 12   tacrolimus (PROTOPIC) 0.03 % ointment Apply topically 2 (two) times daily. 100 g 0   triamcinolone (KENALOG) 0.025 % ointment Apply 1 application topically 2 (two) times daily. 30 g 0   No current facility-administered  medications for this visit.    Allergies-reviewed and updated No Known Allergies  Social History   Socioeconomic History   Marital status: Married    Spouse name: Not on file   Number of children: Not on file   Years of education: Not on file   Highest education level: Not on file   Occupational History   Not on file  Tobacco Use   Smoking status: Never   Smokeless tobacco: Never  Substance and Sexual Activity   Alcohol use: Yes    Comment: occasionally   Drug use: No   Sexual activity: Yes    Partners: Female  Other Topics Concern   Not on file  Social History Narrative   Not on file   Social Determinants of Health   Financial Resource Strain: Not on file  Food Insecurity: Not on file  Transportation Needs: Not on file  Physical Activity: Not on file  Stress: Not on file  Social Connections: Not on file        Objective:  Physical Exam: BP 127/77   Pulse 68   Temp 97.7 F (36.5 C) (Temporal)   Ht 6\' 2"  (1.88 m)   Wt 204 lb 6.4 oz (92.7 kg)   SpO2 100%   BMI 26.24 kg/m   Body mass index is 26.24 kg/m. Wt Readings from Last 3 Encounters:  05/10/21 204 lb 6.4 oz (92.7 kg)  04/24/20 202 lb 9.6 oz (91.9 kg)  04/22/19 196 lb 6.1 oz (89.1 kg)   Gen: NAD, resting comfortably HEENT: TMs normal bilaterally. OP clear. No thyromegaly noted.  CV: RRR with no murmurs appreciated Pulm: NWOB, CTAB with no crackles, wheezes, or rhonchi GI: Normal bowel sounds present. Soft, Nontender, Nondistended. MSK: no edema, cyanosis, or clubbing noted Skin: warm, dry Neuro: CN2-12 grossly intact. Strength 5/5 in upper and lower extremities. Reflexes symmetric and intact bilaterally.  Psych: Normal affect and thought content      I,Savera Zaman,acting as a scribe for 04/24/19, MD.,have documented all relevant documentation on the behalf of Jacquiline Doe, MD,as directed by  Jacquiline Doe, MD while in the presence of Jacquiline Doe, MD.   I, Jacquiline Doe, MD, have reviewed all documentation for this visit. The documentation on 05/10/21 for the exam, diagnosis, procedures, and orders are all accurate and complete.  13/10/22. Katina Degree, MD 05/10/2021 10:33 AM

## 2021-05-10 NOTE — Assessment & Plan Note (Signed)
Check A1c. 

## 2021-05-11 NOTE — Progress Notes (Signed)
Please inform patient of the following:  His "bad" cholesterol is a bit elevated but everything else is NORMAL. Do not need to make any changes to his treatment plan at this time. He should continue working on diet and exercise and we can recheck in a year.  Charles Mccormick. [name], MD 05/11/2021 7:58 AM

## 2021-07-06 DIAGNOSIS — Z3009 Encounter for other general counseling and advice on contraception: Secondary | ICD-10-CM | POA: Diagnosis not present

## 2022-03-25 ENCOUNTER — Encounter: Payer: Self-pay | Admitting: *Deleted

## 2022-05-13 ENCOUNTER — Ambulatory Visit (INDEPENDENT_AMBULATORY_CARE_PROVIDER_SITE_OTHER): Payer: BC Managed Care – PPO | Admitting: Family Medicine

## 2022-05-13 ENCOUNTER — Encounter: Payer: Self-pay | Admitting: Family Medicine

## 2022-05-13 VITALS — BP 126/76 | HR 59 | Temp 97.1°F | Ht 74.0 in | Wt 206.0 lb

## 2022-05-13 DIAGNOSIS — G43009 Migraine without aura, not intractable, without status migrainosus: Secondary | ICD-10-CM

## 2022-05-13 DIAGNOSIS — E785 Hyperlipidemia, unspecified: Secondary | ICD-10-CM | POA: Diagnosis not present

## 2022-05-13 DIAGNOSIS — L209 Atopic dermatitis, unspecified: Secondary | ICD-10-CM

## 2022-05-13 DIAGNOSIS — Z1159 Encounter for screening for other viral diseases: Secondary | ICD-10-CM

## 2022-05-13 DIAGNOSIS — Z0001 Encounter for general adult medical examination with abnormal findings: Secondary | ICD-10-CM | POA: Diagnosis not present

## 2022-05-13 DIAGNOSIS — R739 Hyperglycemia, unspecified: Secondary | ICD-10-CM

## 2022-05-13 LAB — LIPID PANEL
Cholesterol: 180 mg/dL (ref 0–200)
HDL: 40.4 mg/dL (ref >39.00)
LDL Cholesterol: 121 mg/dL — ABNORMAL HIGH (ref 0–99)
NonHDL: 139.18
Total CHOL/HDL Ratio: 4
Triglycerides: 92 mg/dL (ref 0.0–149.0)
VLDL: 18.4 mg/dL (ref 0.0–40.0)

## 2022-05-13 LAB — COMPREHENSIVE METABOLIC PANEL
ALT: 27 U/L (ref 0–53)
AST: 22 U/L (ref 0–37)
Albumin: 4.4 g/dL (ref 3.5–5.2)
Alkaline Phosphatase: 71 U/L (ref 39–117)
BUN: 12 mg/dL (ref 6–23)
CO2: 28 mEq/L (ref 19–32)
Calcium: 9.2 mg/dL (ref 8.4–10.5)
Chloride: 105 mEq/L (ref 96–112)
Creatinine, Ser: 1.01 mg/dL (ref 0.40–1.50)
GFR: 92.68 mL/min (ref >60.00)
Glucose, Bld: 96 mg/dL (ref 70–99)
Potassium: 4.8 mEq/L (ref 3.5–5.1)
Sodium: 139 mEq/L (ref 135–145)
Total Bilirubin: 0.6 mg/dL (ref 0.2–1.2)
Total Protein: 6.9 g/dL (ref 6.0–8.3)

## 2022-05-13 LAB — TSH: TSH: 0.8 u[IU]/mL (ref 0.35–5.50)

## 2022-05-13 LAB — CBC
HCT: 46.3 % (ref 39.0–52.0)
Hemoglobin: 15.7 g/dL (ref 13.0–17.0)
MCHC: 34 g/dL (ref 30.0–36.0)
MCV: 86.3 fl (ref 78.0–100.0)
Platelets: 244 10*3/uL (ref 150.0–400.0)
RBC: 5.37 Mil/uL (ref 4.22–5.81)
RDW: 13 % (ref 11.5–15.5)
WBC: 5.7 10*3/uL (ref 4.0–10.5)

## 2022-05-13 LAB — HEMOGLOBIN A1C: Hgb A1c MFr Bld: 5.4 % (ref 4.6–6.5)

## 2022-05-13 NOTE — Patient Instructions (Signed)
It was very nice to see you today!  We we will check blood work today.  Please continue the good work with your diet and exercise.  We will see back in year for your next physical.  Come back sooner if needed.  Take care, Dr [name]  PLEASE NOTE:  If you had any lab tests please let us know if you have not heard back within a few days. You may see your results on mychart before we have a chance to review them but we will give you a call once they are reviewed by [place]. If we ordered any referrals today, please let us know if you have not heard from their office within the next week.   Please try these tips to maintain a healthy lifestyle:  Eat at least 3 REAL meals and 1-2 snacks per day.  Aim for no more than 5 hours between eating.  If you eat breakfast, please do so within one hour of getting up.   Each meal should contain half fruits/vegetables, one quarter protein, and one quarter carbs (no bigger than a computer mouse)  Cut down on sweet beverages. This includes juice, soda, and sweet tea.   Drink at least 1 glass of water with each meal and aim for at least 8 glasses per day  Exercise at least 150 minutes every week.    Preventive Care 70-98 Years Old, Male Preventive care refers to lifestyle choices and visits with your health care provider that can promote health and wellness. Preventive care visits are also called wellness exams. What can I expect for my preventive care visit? Counseling During your preventive care visit, your health care provider may ask about your: Medical history, including: Past medical problems. Family medical history. Current health, including: Emotional well-being. Home life and relationship well-being. Sexual activity. Lifestyle, including: Alcohol, nicotine or tobacco, and drug use. Access to firearms. Diet, exercise, and sleep habits. Safety issues such as seatbelt and bike helmet use. Sunscreen use. Work and work Astronomer. Physical  exam Your health care provider will check your: Height and weight. These may be used to calculate your BMI (body mass index). BMI is a measurement that tells if you are at a healthy weight. Waist circumference. This measures the distance around your waistline. This measurement also tells if you are at a healthy weight and may help predict your risk of certain diseases, such as type 2 diabetes and high blood pressure. Heart rate and blood pressure. Body temperature. Skin for abnormal spots. What immunizations do I need?  Vaccines are usually given at various ages, according to a schedule. Your health care provider will recommend vaccines for you based on your age, medical history, and lifestyle or other factors, such as travel or where you work. What tests do I need? Screening Your health care provider may recommend screening tests for certain conditions. This may include: Lipid and cholesterol levels. Diabetes screening. This is done by checking your blood sugar (glucose) after you have not eaten for a while (fasting). Hepatitis B test. Hepatitis C test. HIV (human immunodeficiency virus) test. STI (sexually transmitted infection) testing, if you are at risk. Lung cancer screening. Prostate cancer screening. Colorectal cancer screening. Talk with your health care provider about your test results, treatment options, and if necessary, the need for more tests. Follow these instructions at home: Eating and drinking  Eat a diet that includes fresh fruits and vegetables, whole grains, lean protein, and low-fat dairy products. Take vitamin and mineral supplements  as recommended by your health care provider. Do not drink alcohol if your health care provider tells you not to drink. If you drink alcohol: Limit how much you have to 0-2 drinks a day. Know how much alcohol is in your drink. In the [place] one drink equals one 12 oz bottle of beer (355 mL), one 5 oz glass of wine (148 mL), or one 1  oz glass of hard liquor (44 mL). Lifestyle Brush your teeth every morning and night with fluoride toothpaste. Floss one time each day. Exercise for at least 30 minutes 5 or more days each week. Do not use any products that contain nicotine or tobacco. These products include cigarettes, chewing tobacco, and vaping devices, such as e-cigarettes. If you need help quitting, ask your health care provider. Do not use drugs. If you are sexually active, practice safe sex. Use a condom or other form of protection to prevent STIs. Take aspirin only as told by your health care provider. Make sure that you understand how much to take and what form to take. Work with your health care provider to find out whether it is safe and beneficial for you to take aspirin daily. Find healthy ways to manage stress, such as: Meditation, yoga, or listening to music. Journaling. Talking to a trusted person. Spending time with friends and family. Minimize exposure to UV radiation to reduce your risk of skin cancer. Safety Always wear your seat belt while driving or riding in a vehicle. Do not drive: If you have been drinking alcohol. Do not ride with someone who has been drinking. When you are tired or distracted. While texting. If you have been using any mind-altering substances or drugs. Wear a helmet and other protective equipment during sports activities. If you have firearms in your house, make sure you follow all gun safety procedures. What's next? Go to your health care provider once a year for an annual wellness visit. Ask your health care provider how often you should have your eyes and teeth checked. Stay up to date on all vaccines. This information is not intended to replace advice given to you by your health care provider. Make sure you discuss any questions you have with your health care provider. Document Revised: 12/13/2020 Document Reviewed: 12/13/2020 Elsevier Patient Education  2023 [employer].

## 2022-05-13 NOTE — Assessment & Plan Note (Signed)
Check A1c.  Discussed lifestyle modifications. °

## 2022-05-13 NOTE — Assessment & Plan Note (Signed)
Stable on triamcinolone and Protopic as needed.  Does not need refill today.

## 2022-05-13 NOTE — Progress Notes (Signed)
Chief Complaint:  Charles Mccormick is a 41 y.o. male who presents today for his annual comprehensive physical exam.    Assessment/Plan:  Chronic Problems Addressed Today: Atopic dermatitis, mild Stable on triamcinolone and Protopic as needed.  Does not need refill today.  Dyslipidemia Check labs.  Discussed lifestyle modifications.  Hyperglycemia Check A1c.  Discussed lifestyle modifications.  Migraine without aura and without status migrainosus, not intractable Stable.  No recent flares.  He has Imitrex to use as needed though is not needed for a while.  Preventative Healthcare: Check labs.  Flu vaccine declined.  Patient Counseling(The following topics were reviewed and/or handout was given):  -Nutrition: Stressed importance of moderation in sodium/caffeine intake, saturated fat and cholesterol, caloric balance, sufficient intake of fresh fruits, vegetables, and fiber.  -Stressed the importance of regular exercise.   -Substance Abuse: Discussed cessation/primary prevention of tobacco, alcohol, or other drug use; driving or other dangerous activities under the influence; availability of treatment for abuse.   -Injury prevention: Discussed safety belts, safety helmets, smoke detector, smoking near bedding or upholstery.   -Sexuality: Discussed sexually transmitted diseases, partner selection, use of condoms, avoidance of unintended pregnancy and contraceptive alternatives.   -Dental health: Discussed importance of regular tooth brushing, flossing, and dental visits.  -Health maintenance and immunizations reviewed. Please refer to Health maintenance section.  Return to care in 1 year for next preventative visit.     Subjective:  HPI:  He has no acute complaints today.   Lifestyle Diet: Balanced. Plenty of fruits and vegetables.  Exercise: Tries to walk and lift weights.      05/13/2022    7:54 AM  Depression screen PHQ 2/9  Decreased Interest 0  Down, Depressed,  Hopeless 0  PHQ - 2 Score 0    Health Maintenance Due  Topic Date Due   Hepatitis C Screening  Never done     ROS: Per HPI, otherwise a complete review of systems was negative.   PMH:  The following were reviewed and entered/updated in epic: Past Medical History:  Diagnosis Date   Migraines    Patient Active Problem List   Diagnosis Date Noted   Degenerative joint disease of left hip 04/24/2020   Enthesopathy of left hip region 12/06/2019   Dyslipidemia 04/22/2019   Hyperglycemia 04/22/2019   Atopic dermatitis, mild 04/08/2018   Migraine without aura and without status migrainosus, not intractable 07/19/2017   History reviewed. No pertinent surgical history.  Family History  Problem Relation Age of Onset   Breast cancer Mother    Thyroid disease Sister    Stroke Paternal Grandfather     Medications- reviewed and updated Current Outpatient Medications  Medication Sig Dispense Refill   SUMAtriptan (IMITREX) 100 MG tablet TAKE 1 TABLET BYMOUTH FOR MIGRAINE MAY REPEAT IN 2 HOURS IF HEADACHE PERSISTS OR RECURS 10 tablet 12   tacrolimus (PROTOPIC) 0.03 % ointment Apply topically 2 (two) times daily. 100 g 0   triamcinolone (KENALOG) 0.025 % ointment Apply 1 application topically 2 (two) times daily. 30 g 0   No current facility-administered medications for this visit.    Allergies-reviewed and updated No Known Allergies  Social History   Socioeconomic History   Marital status: Married    Spouse name: Not on file   Number of children: Not on file   Years of education: Not on file   Highest education level: Not on file  Occupational History   Not on file  Tobacco Use   Smoking  status: Never   Smokeless tobacco: Never  Substance and Sexual Activity   Alcohol use: Yes    Comment: occasionally   Drug use: No   Sexual activity: Yes    Partners: Female  Other Topics Concern   Not on file  Social History Narrative   Not on file   Social Determinants of  Health   Financial Resource Strain: Not on file  Food Insecurity: Not on file  Transportation Needs: Not on file  Physical Activity: Not on file  Stress: Not on file  Social Connections: Not on file        Objective:  Physical Exam: BP 126/76   Pulse (!) 59   Temp (!) 97.1 F (36.2 C) (Temporal)   Ht 6\' 2"  (1.88 m)   Wt 206 lb (93.4 kg)   SpO2 97%   BMI 26.45 kg/m   Body mass index is 26.45 kg/m. Wt Readings from Last 3 Encounters:  05/13/22 206 lb (93.4 kg)  05/10/21 204 lb 6.4 oz (92.7 kg)  04/24/20 202 lb 9.6 oz (91.9 kg)   Gen: NAD, resting comfortably HEENT: TMs normal bilaterally. OP clear. No thyromegaly noted.  CV: RRR with no murmurs appreciated Pulm: NWOB, CTAB with no crackles, wheezes, or rhonchi GI: Normal bowel sounds present. Soft, Nontender, Nondistended. MSK: no edema, cyanosis, or clubbing noted Skin: warm, dry Neuro: CN2-12 grossly intact. Strength 5/5 in upper and lower extremities. Reflexes symmetric and intact bilaterally.  Psych: Normal affect and thought content     Charles Mccormick M. 04/26/20, MD 05/13/2022 8:13 AM

## 2022-05-13 NOTE — Assessment & Plan Note (Signed)
Stable.  No recent flares.  He has Imitrex to use as needed though is not needed for a while.

## 2022-05-13 NOTE — Assessment & Plan Note (Signed)
Check labs.  Discussed lifestyle modifications. 

## 2022-05-14 LAB — HEPATITIS C ANTIBODY: Hepatitis C Ab: NONREACTIVE

## 2022-05-16 NOTE — Progress Notes (Signed)
Please inform patient of the following:  Cholesterol borderline elevated but overall everything is stable compared to last year.  He should continue to work on diet and exercise and we can recheck in a year or so.

## 2023-05-19 ENCOUNTER — Encounter: Payer: Self-pay | Admitting: Family Medicine

## 2023-05-19 ENCOUNTER — Ambulatory Visit (INDEPENDENT_AMBULATORY_CARE_PROVIDER_SITE_OTHER): Payer: BC Managed Care – PPO | Admitting: Family Medicine

## 2023-05-19 VITALS — BP 127/82 | HR 65 | Temp 98.7°F | Ht 74.0 in | Wt 208.4 lb

## 2023-05-19 DIAGNOSIS — G43009 Migraine without aura, not intractable, without status migrainosus: Secondary | ICD-10-CM

## 2023-05-19 DIAGNOSIS — E785 Hyperlipidemia, unspecified: Secondary | ICD-10-CM | POA: Diagnosis not present

## 2023-05-19 DIAGNOSIS — R739 Hyperglycemia, unspecified: Secondary | ICD-10-CM

## 2023-05-19 DIAGNOSIS — Z0001 Encounter for general adult medical examination with abnormal findings: Secondary | ICD-10-CM

## 2023-05-19 LAB — LIPID PANEL
Cholesterol: 190 mg/dL (ref 0–200)
HDL: 42.8 mg/dL (ref >39.00)
LDL Cholesterol: 129 mg/dL — ABNORMAL HIGH (ref 0–99)
NonHDL: 147.68
Total CHOL/HDL Ratio: 4
Triglycerides: 93 mg/dL (ref 0.0–149.0)
VLDL: 18.6 mg/dL (ref 0.0–40.0)

## 2023-05-19 LAB — COMPREHENSIVE METABOLIC PANEL
ALT: 43 U/L (ref 0–53)
AST: 27 U/L (ref 0–37)
Albumin: 4.5 g/dL (ref 3.5–5.2)
Alkaline Phosphatase: 65 U/L (ref 39–117)
BUN: 10 mg/dL (ref 6–23)
CO2: 27 meq/L (ref 19–32)
Calcium: 9.4 mg/dL (ref 8.4–10.5)
Chloride: 101 meq/L (ref 96–112)
Creatinine, Ser: 1.04 mg/dL (ref 0.40–1.50)
GFR: 88.84 mL/min (ref >60.00)
Glucose, Bld: 96 mg/dL (ref 70–99)
Potassium: 3.7 meq/L (ref 3.5–5.1)
Sodium: 136 meq/L (ref 135–145)
Total Bilirubin: 0.8 mg/dL (ref 0.2–1.2)
Total Protein: 7.1 g/dL (ref 6.0–8.3)

## 2023-05-19 LAB — CBC
HCT: 48.5 % (ref 39.0–52.0)
Hemoglobin: 16.5 g/dL (ref 13.0–17.0)
MCHC: 34 g/dL (ref 30.0–36.0)
MCV: 87.7 fL (ref 78.0–100.0)
Platelets: 268 10*3/uL (ref 150.0–400.0)
RBC: 5.53 Mil/uL (ref 4.22–5.81)
RDW: 13.1 % (ref 11.5–15.5)
WBC: 5.9 10*3/uL (ref 4.0–10.5)

## 2023-05-19 LAB — HEMOGLOBIN A1C: Hgb A1c MFr Bld: 5.4 % (ref 4.6–6.5)

## 2023-05-19 LAB — TSH: TSH: 1.35 u[IU]/mL (ref 0.35–5.50)

## 2023-05-19 MED ORDER — SUMATRIPTAN SUCCINATE 100 MG PO TABS: ORAL_TABLET | ORAL | 12 refills | Status: AC

## 2023-05-19 NOTE — Patient Instructions (Signed)
It was very nice to see you today!  We will check blood work today.  Please continue to work on diet and exercise.  Return in about 1 year (around 05/18/2024) for Annual Physical.   Take care, Dr [name]  PLEASE NOTE:  If you had any lab tests, please let us know if you have not heard back within a few days. You may see your results on mychart before we have a chance to review them but we will give you a call once they are reviewed by [place].   If we ordered any referrals today, please let us know if you have not heard from their office within the next week.   If you had any urgent prescriptions sent in today, please check with the pharmacy within an hour of our visit to make sure the prescription was transmitted appropriately.   Please try these tips to maintain a healthy lifestyle:  Eat at least 3 REAL meals and 1-2 snacks per day.  Aim for no more than 5 hours between eating.  If you eat breakfast, please do so within one hour of getting up.   Each meal should contain half fruits/vegetables, one quarter protein, and one quarter carbs (no bigger than a computer mouse)  Cut down on sweet beverages. This includes juice, soda, and sweet tea.   Drink at least 1 glass of water with each meal and aim for at least 8 glasses per day  Exercise at least 150 minutes every week.    Preventive Care 61-88 Years Old, Male Preventive care refers to lifestyle choices and visits with your health care provider that can promote health and wellness. Preventive care visits are also called wellness exams. What can I expect for my preventive care visit? Counseling During your preventive care visit, your health care provider may ask about your: Medical history, including: Past medical problems. Family medical history. Current health, including: Emotional well-being. Home life and relationship well-being. Sexual activity. Lifestyle, including: Alcohol, nicotine or tobacco, and drug use. Access to  firearms. Diet, exercise, and sleep habits. Safety issues such as seatbelt and bike helmet use. Sunscreen use. Work and work Astronomer. Physical exam Your health care provider will check your: Height and weight. These may be used to calculate your BMI (body mass index). BMI is a measurement that tells if you are at a healthy weight. Waist circumference. This measures the distance around your waistline. This measurement also tells if you are at a healthy weight and may help predict your risk of certain diseases, such as type 2 diabetes and high blood pressure. Heart rate and blood pressure. Body temperature. Skin for abnormal spots. What immunizations do I need?  Vaccines are usually given at various ages, according to a schedule. Your health care provider will recommend vaccines for you based on your age, medical history, and lifestyle or other factors, such as travel or where you work. What tests do I need? Screening Your health care provider may recommend screening tests for certain conditions. This may include: Lipid and cholesterol levels. Diabetes screening. This is done by checking your blood sugar (glucose) after you have not eaten for a while (fasting). Hepatitis B test. Hepatitis C test. HIV (human immunodeficiency virus) test. STI (sexually transmitted infection) testing, if you are at risk. Lung cancer screening. Prostate cancer screening. Colorectal cancer screening. Talk with your health care provider about your test results, treatment options, and if necessary, the need for more tests. Follow these instructions at home: Eating and  drinking  Eat a diet that includes fresh fruits and vegetables, whole grains, lean protein, and low-fat dairy products. Take vitamin and mineral supplements as recommended by your health care provider. Do not drink alcohol if your health care provider tells you not to drink. If you drink alcohol: Limit how much you have to 0-2 drinks a  day. Know how much alcohol is in your drink. In the [place] one drink equals one 12 oz bottle of beer (355 mL), one 5 oz glass of wine (148 mL), or one 1 oz glass of hard liquor (44 mL). Lifestyle Brush your teeth every morning and night with fluoride toothpaste. Floss one time each day. Exercise for at least 30 minutes 5 or more days each week. Do not use any products that contain nicotine or tobacco. These products include cigarettes, chewing tobacco, and vaping devices, such as e-cigarettes. If you need help quitting, ask your health care provider. Do not use drugs. If you are sexually active, practice safe sex. Use a condom or other form of protection to prevent STIs. Take aspirin only as told by your health care provider. Make sure that you understand how much to take and what form to take. Work with your health care provider to find out whether it is safe and beneficial for you to take aspirin daily. Find healthy ways to manage stress, such as: Meditation, yoga, or listening to music. Journaling. Talking to a trusted person. Spending time with friends and family. Minimize exposure to UV radiation to reduce your risk of skin cancer. Safety Always wear your seat belt while driving or riding in a vehicle. Do not drive: If you have been drinking alcohol. Do not ride with someone who has been drinking. When you are tired or distracted. While texting. If you have been using any mind-altering substances or drugs. Wear a helmet and other protective equipment during sports activities. If you have firearms in your house, make sure you follow all gun safety procedures. What's next? Go to your health care provider once a year for an annual wellness visit. Ask your health care provider how often you should have your eyes and teeth checked. Stay up to date on all vaccines. This information is not intended to replace advice given to you by your health care provider. Make sure you discuss any  questions you have with your health care provider. Document Revised: 12/13/2020 Document Reviewed: 12/13/2020 Elsevier Patient Education  2024 [employer].

## 2023-05-19 NOTE — Assessment & Plan Note (Signed)
Stable.  Has about 1 migraine per month.  Uses Imitrex as needed.  Will refill today.  Tolerates well.

## 2023-05-19 NOTE — Progress Notes (Signed)
Cholesterol a little bit higher than last year.  He should focus on diet and exercise and we can recheck in a year.  Everything else is normal.

## 2023-05-19 NOTE — Progress Notes (Signed)
Chief Complaint:  Charles Mccormick is a 42 y.o. male who presents today for his annual comprehensive physical exam.    Assessment/Plan:  Chronic Problems Addressed Today: Migraine without aura and without status migrainosus, not intractable Stable.  Has about 1 migraine per month.  Uses Imitrex as needed.  Will refill today.  Tolerates well.  Dyslipidemia Check lipids.  Discussed lifestyle modifications.  Hyperglycemia Check A1c.  Discussed lifestyle modifications.   Preventative Healthcare: Check labs.  Flu vaccine declined.  Patient Counseling(The following topics were reviewed and/or handout was given):  -Nutrition: Stressed importance of moderation in sodium/caffeine intake, saturated fat and cholesterol, caloric balance, sufficient intake of fresh fruits, vegetables, and fiber.  -Stressed the importance of regular exercise.   -Substance Abuse: Discussed cessation/primary prevention of tobacco, alcohol, or other drug use; driving or other dangerous activities under the influence; availability of treatment for abuse.   -Injury prevention: Discussed safety belts, safety helmets, smoke detector, smoking near bedding or upholstery.   -Sexuality: Discussed sexually transmitted diseases, partner selection, use of condoms, avoidance of unintended pregnancy and contraceptive alternatives.   -Dental health: Discussed importance of regular tooth brushing, flossing, and dental visits.  -Health maintenance and immunizations reviewed. Please refer to Health maintenance section.  Return to care in 1 year for next preventative visit.     Subjective:  HPI:  He has no acute complaints today. See Assessment / plan for status of chronic conditions.   Lifestyle Diet: Balanced. Plenty of fruits and vegetables.  Exercise: None specific. Tries to walk and stay active around the house.      05/19/2023    7:57 AM  Depression screen PHQ 2/9  Decreased Interest 0  Down, Depressed, Hopeless  0  PHQ - 2 Score 0    There are no preventive care reminders to display for this patient.   ROS: Per HPI, otherwise a complete review of systems was negative.   PMH:  The following were reviewed and entered/updated in epic: Past Medical History:  Diagnosis Date   Migraines    Patient Active Problem List   Diagnosis Date Noted   Degenerative joint disease of left hip 04/24/2020   Enthesopathy of left hip region 12/06/2019   Dyslipidemia 04/22/2019   Hyperglycemia 04/22/2019   Atopic dermatitis, mild 04/08/2018   Migraine without aura and without status migrainosus, not intractable 07/19/2017   History reviewed. No pertinent surgical history.  Family History  Problem Relation Age of Onset   Breast cancer Mother    Thyroid disease Sister    Stroke Paternal Grandfather     Medications- reviewed and updated Current Outpatient Medications  Medication Sig Dispense Refill   tacrolimus (PROTOPIC) 0.03 % ointment Apply topically 2 (two) times daily. 100 g 0   triamcinolone (KENALOG) 0.025 % ointment Apply 1 application topically 2 (two) times daily. 30 g 0   SUMAtriptan (IMITREX) 100 MG tablet TAKE 1 TABLET BYMOUTH FOR MIGRAINE MAY REPEAT IN 2 HOURS IF HEADACHE PERSISTS OR RECURS 10 tablet 12   No current facility-administered medications for this visit.    Allergies-reviewed and updated No Known Allergies  Social History   Socioeconomic History   Marital status: Married    Spouse name: Not on file   Number of children: Not on file   Years of education: Not on file   Highest education level: Not on file  Occupational History   Not on file  Tobacco Use   Smoking status: Never   Smokeless tobacco: Never  Substance and Sexual Activity   Alcohol use: Yes    Comment: occasionally   Drug use: No   Sexual activity: Yes    Partners: Female  Other Topics Concern   Not on file  Social History Narrative   Not on file   Social Determinants of Health   Financial  Resource Strain: Not on file  Food Insecurity: Not on file  Transportation Needs: Not on file  Physical Activity: Not on file  Stress: Not on file  Social Connections: Not on file        Objective:  Physical Exam: BP 127/82   Pulse 65   Temp 98.7 F (37.1 C) (Temporal)   Ht 6\' 2"  (1.88 m)   Wt 208 lb 6.4 oz (94.5 kg)   SpO2 97%   BMI 26.76 kg/m   Body mass index is 26.76 kg/m. Wt Readings from Last 3 Encounters:  05/19/23 208 lb 6.4 oz (94.5 kg)  05/13/22 206 lb (93.4 kg)  05/10/21 204 lb 6.4 oz (92.7 kg)   Gen: NAD, resting comfortably HEENT: TMs normal bilaterally. OP clear. No thyromegaly noted.  CV: RRR with no murmurs appreciated Pulm: NWOB, CTAB with no crackles, wheezes, or rhonchi GI: Normal bowel sounds present. Soft, Nontender, Nondistended. MSK: no edema, cyanosis, or clubbing noted Skin: warm, dry Neuro: CN2-12 grossly intact. Strength 5/5 in upper and lower extremities. Reflexes symmetric and intact bilaterally.  Psych: Normal affect and thought content     Dorman Calderwood M. [name], MD 05/19/2023 8:18 AM

## 2023-05-19 NOTE — Assessment & Plan Note (Signed)
Check A1c.  Discussed lifestyle modifications. °

## 2023-05-19 NOTE — Assessment & Plan Note (Signed)
Check lipids. Discussed lifestyle modifications.  

## 2024-05-20 ENCOUNTER — Ambulatory Visit: Payer: Self-pay | Admitting: Family Medicine

## 2024-05-20 ENCOUNTER — Encounter: Payer: Self-pay | Admitting: Family Medicine

## 2024-05-20 ENCOUNTER — Ambulatory Visit (INDEPENDENT_AMBULATORY_CARE_PROVIDER_SITE_OTHER): Payer: BC Managed Care – PPO | Admitting: Family Medicine

## 2024-05-20 VITALS — BP 122/82 | HR 67 | Temp 98.2°F | Ht 74.0 in | Wt 201.2 lb

## 2024-05-20 DIAGNOSIS — Z0001 Encounter for general adult medical examination with abnormal findings: Secondary | ICD-10-CM

## 2024-05-20 DIAGNOSIS — E785 Hyperlipidemia, unspecified: Secondary | ICD-10-CM | POA: Diagnosis not present

## 2024-05-20 DIAGNOSIS — G43009 Migraine without aura, not intractable, without status migrainosus: Secondary | ICD-10-CM

## 2024-05-20 DIAGNOSIS — L209 Atopic dermatitis, unspecified: Secondary | ICD-10-CM | POA: Diagnosis not present

## 2024-05-20 DIAGNOSIS — R739 Hyperglycemia, unspecified: Secondary | ICD-10-CM | POA: Diagnosis not present

## 2024-05-20 LAB — LIPID PANEL
Cholesterol: 154 mg/dL (ref 0–200)
HDL: 44.7 mg/dL (ref >39.00)
LDL Cholesterol: 96 mg/dL (ref 0–99)
NonHDL: 109.57
Total CHOL/HDL Ratio: 3
Triglycerides: 68 mg/dL (ref 0.0–149.0)
VLDL: 13.6 mg/dL (ref 0.0–40.0)

## 2024-05-20 LAB — COMPREHENSIVE METABOLIC PANEL WITH GFR
ALT: 27 U/L (ref 0–53)
AST: 23 U/L (ref 0–37)
Albumin: 4.8 g/dL (ref 3.5–5.2)
Alkaline Phosphatase: 77 U/L (ref 39–117)
BUN: 13 mg/dL (ref 6–23)
CO2: 27 meq/L (ref 19–32)
Calcium: 9.6 mg/dL (ref 8.4–10.5)
Chloride: 101 meq/L (ref 96–112)
Creatinine, Ser: 1.05 mg/dL (ref 0.40–1.50)
GFR: 87.21 mL/min (ref >60.00)
Glucose, Bld: 96 mg/dL (ref 70–99)
Potassium: 4.5 meq/L (ref 3.5–5.1)
Sodium: 136 meq/L (ref 135–145)
Total Bilirubin: 1 mg/dL (ref 0.2–1.2)
Total Protein: 7.6 g/dL (ref 6.0–8.3)

## 2024-05-20 LAB — CBC
HCT: 47.6 % (ref 39.0–52.0)
Hemoglobin: 16.3 g/dL (ref 13.0–17.0)
MCHC: 34.1 g/dL (ref 30.0–36.0)
MCV: 86.3 fl (ref 78.0–100.0)
Platelets: 263 K/uL (ref 150.0–400.0)
RBC: 5.52 Mil/uL (ref 4.22–5.81)
RDW: 13 % (ref 11.5–15.5)
WBC: 5.6 K/uL (ref 4.0–10.5)

## 2024-05-20 LAB — TSH: TSH: 0.85 u[IU]/mL (ref 0.35–5.50)

## 2024-05-20 LAB — HEMOGLOBIN A1C: Hgb A1c MFr Bld: 5.1 % (ref 4.6–6.5)

## 2024-05-20 NOTE — Assessment & Plan Note (Signed)
 Stable Imitrex  as needed.  Does not need refill today.

## 2024-05-20 NOTE — Assessment & Plan Note (Signed)
Stable on triamcinolone and Protopic as needed.  Does not need refill today.

## 2024-05-20 NOTE — Assessment & Plan Note (Signed)
 Check lipids

## 2024-05-20 NOTE — Assessment & Plan Note (Signed)
 Check A1c.

## 2024-05-20 NOTE — Progress Notes (Signed)
 Great news!  Labs are all normal.  Cholesterol much better than last year.  He should keep up the great work and we can recheck everything in a year

## 2024-05-20 NOTE — Patient Instructions (Signed)
 It was very nice to see you today!  VISIT SUMMARY: Today, you had your annual physical exam. You have lost 7 pounds over the past year due to increased exercise and portion control. Your blood pressure is well-controlled, and we have ordered blood work to monitor your cholesterol levels. We also discussed your management of migraines and dermatitis.  YOUR PLAN: ADULT WELLNESS VISIT: Routine visit noted a 7-pound weight loss attributed to increased exercise and portion control. Blood pressure is well-controlled. -Continue your current exercise and dietary regimen. -Blood work has been ordered to monitor your cholesterol levels.  HYPERLIPIDEMIA: Cholesterol levels require monitoring, with weight loss potentially improving levels. -Blood work has been ordered to monitor your cholesterol levels.  MIGRAINE: Managed with Imitrex , with decreased frequency of use indicating improved control. -Continue using Imitrex  as needed for migraines. -Your Imitrex  prescription has been refilled.  ATOPIC DERMATITIS: Managed with triamcinolone  cream, with no new issues reported. -Continue using triamcinolone  cream as needed for dermatitis. -Your triamcinolone  cream prescription has been refilled.  Return in about 1 year (around 05/20/2025) for Annual Physical.   Take care, Dr Kennyth  PLEASE NOTE:  If you had any lab tests, please let us  know if you have not heard back within a few days. You may see your results on mychart before we have a chance to review them but we will give you a call once they are reviewed by us .   If we ordered any referrals today, please let us  know if you have not heard from their office within the next week.   If you had any urgent prescriptions sent in today, please check with the pharmacy within an hour of our visit to make sure the prescription was transmitted appropriately.   Please try these tips to maintain a healthy lifestyle:  Eat at least 3 REAL meals and 1-2 snacks  per day.  Aim for no more than 5 hours between eating.  If you eat breakfast, please do so within one hour of getting up.   Each meal should contain half fruits/vegetables, one quarter protein, and one quarter carbs (no bigger than a computer mouse)  Cut down on sweet beverages. This includes juice, soda, and sweet tea.   Drink at least 1 glass of water with each meal and aim for at least 8 glasses per day  Exercise at least 150 minutes every week.     Preventive Care 12-33 Years Old, Male Preventive care refers to lifestyle choices and visits with your health care provider that can promote health and wellness. Preventive care visits are also called wellness exams. What can I expect for my preventive care visit? Counseling During your preventive care visit, your health care provider may ask about your: Medical history, including: Past medical problems. Family medical history. Current health, including: Emotional well-being. Home life and relationship well-being. Sexual activity. Lifestyle, including: Alcohol, nicotine or tobacco, and drug use. Access to firearms. Diet, exercise, and sleep habits. Safety issues such as seatbelt and bike helmet use. Sunscreen use. Work and work astronomer. Physical exam Your health care provider will check your: Height and weight. These may be used to calculate your BMI (body mass index). BMI is a measurement that tells if you are at a healthy weight. Waist circumference. This measures the distance around your waistline. This measurement also tells if you are at a healthy weight and may help predict your risk of certain diseases, such as type 2 diabetes and high blood pressure. Heart rate and blood  pressure. Body temperature. Skin for abnormal spots. What immunizations do I need?  Vaccines are usually given at various ages, according to a schedule. Your health care provider will recommend vaccines for you based on your age, medical history, and  lifestyle or other factors, such as travel or where you work. What tests do I need? Screening Your health care provider may recommend screening tests for certain conditions. This may include: Lipid and cholesterol levels. Diabetes screening. This is done by checking your blood sugar (glucose) after you have not eaten for a while (fasting). Hepatitis B test. Hepatitis C test. HIV (human immunodeficiency virus) test. STI (sexually transmitted infection) testing, if you are at risk. Lung cancer screening. Prostate cancer screening. Colorectal cancer screening. Talk with your health care provider about your test results, treatment options, and if necessary, the need for more tests. Follow these instructions at home: Eating and drinking  Eat a diet that includes fresh fruits and vegetables, whole grains, lean protein, and low-fat dairy products. Take vitamin and mineral supplements as recommended by your health care provider. Do not drink alcohol if your health care provider tells you not to drink. If you drink alcohol: Limit how much you have to 0-2 drinks a day. Know how much alcohol is in your drink. In the [place] one drink equals one 12 oz bottle of beer (355 mL), one 5 oz glass of wine (148 mL), or one 1 oz glass of hard liquor (44 mL). Lifestyle Brush your teeth every morning and night with fluoride toothpaste. Floss one time each day. Exercise for at least 30 minutes 5 or more days each week. Do not use any products that contain nicotine or tobacco. These products include cigarettes, chewing tobacco, and vaping devices, such as e-cigarettes. If you need help quitting, ask your health care provider. Do not use drugs. If you are sexually active, practice safe sex. Use a condom or other form of protection to prevent STIs. Take aspirin only as told by your health care provider. Make sure that you understand how much to take and what form to take. Work with your health care provider to find  out whether it is safe and beneficial for you to take aspirin daily. Find healthy ways to manage stress, such as: Meditation, yoga, or listening to music. Journaling. Talking to a trusted person. Spending time with friends and family. Minimize exposure to UV radiation to reduce your risk of skin cancer. Safety Always wear your seat belt while driving or riding in a vehicle. Do not drive: If you have been drinking alcohol. Do not ride with someone who has been drinking. When you are tired or distracted. While texting. If you have been using any mind-altering substances or drugs. Wear a helmet and other protective equipment during sports activities. If you have firearms in your house, make sure you follow all gun safety procedures. What's next? Go to your health care provider once a year for an annual wellness visit. Ask your health care provider how often you should have your eyes and teeth checked. Stay up to date on all vaccines. This information is not intended to replace advice given to you by your health care provider. Make sure you discuss any questions you have with your health care provider. Document Revised: 12/13/2020 Document Reviewed: 12/13/2020 Elsevier Patient Education  2024 [employer].

## 2024-05-20 NOTE — Addendum Note (Signed)
 Addended by: KENNYTH WORTH HERO on: 05/20/2024 09:06 AM   Modules accepted: Level of Service

## 2024-05-20 NOTE — Progress Notes (Signed)
 Chief Complaint:  Charles Mccormick is a 43 y.o. male who presents today for his annual comprehensive physical exam.    Assessment/Plan:   Chronic Problems Addressed Today: Dyslipidemia Check lipids.   Hyperglycemia Check A1c.  Atopic dermatitis, mild Stable on triamcinolone  and Protopic  as needed.  Does not need refill today.  Migraine without aura and without status migrainosus, not intractable Stable Imitrex  as needed.  Does not need refill today.  Preventative Healthcare: Check labs.  Vaccines declined.  Patient Counseling(The following topics were reviewed and/or handout was given):  -Nutrition: Stressed importance of moderation in sodium/caffeine intake, saturated fat and cholesterol, caloric balance, sufficient intake of fresh fruits, vegetables, and fiber.  -Stressed the importance of regular exercise.   -Substance Abuse: Discussed cessation/primary prevention of tobacco, alcohol, or other drug use; driving or other dangerous activities under the influence; availability of treatment for abuse.   -Injury prevention: Discussed safety belts, safety helmets, smoke detector, smoking near bedding or upholstery.   -Sexuality: Discussed sexually transmitted diseases, partner selection, use of condoms, avoidance of unintended pregnancy and contraceptive alternatives.   -Dental health: Discussed importance of regular tooth brushing, flossing, and dental visits.  -Health maintenance and immunizations reviewed. Please refer to Health maintenance section.  Return to care in 1 year for next preventative visit.     Subjective:  HPI:  He has no acute complaints today. Patient is here today for his annual physical.  See assessment / plan for status of chronic conditions.  Discussed the use of AI scribe software for clinical note transcription with the patient, who gave verbal consent to proceed.  History of Present Illness Charles Mccormick is a 43 year old male who presents for  an annual physical exam.  He has lost approximately seven pounds over the past year, which he attributes to increased exercise and portion control. His exercise routine includes ten to fifteen minutes of cardio and strength training exercises on most days. Dietary changes involve consuming more lean proteins such as chicken and turkey, while reducing intake of beef, sweets, and carbohydrates.  He uses Imitrex  for migraine management and notes a decreased need for it compared to previous use, though he is unsure of the reason for this change.  He uses triamcinolone  cream for dermatitis management and reports no issues with the current treatment.  He works and reports being kept busy, with two days off for Thanksgiving and the last two weeks of the year off.       05/20/2024    8:13 AM  Depression screen PHQ 2/9  Decreased Interest 0  Down, Depressed, Hopeless 0  PHQ - 2 Score 0    There are no preventive care reminders to display for this patient.   ROS: Per HPI, otherwise a complete review of systems was negative.   PMH:  The following were reviewed and entered/updated in epic: Past Medical History:  Diagnosis Date   Migraines    Patient Active Problem List   Diagnosis Date Noted   Degenerative joint disease of left hip 04/24/2020   Enthesopathy of left hip region 12/06/2019   Dyslipidemia 04/22/2019   Hyperglycemia 04/22/2019   Atopic dermatitis, mild 04/08/2018   Migraine without aura and without status migrainosus, not intractable 07/19/2017   History reviewed. No pertinent surgical history.  Family History  Problem Relation Age of Onset   Breast cancer Mother    Thyroid  disease Sister    Stroke Paternal Grandfather     Medications- reviewed and updated  Current Outpatient Medications  Medication Sig Dispense Refill   SUMAtriptan  (IMITREX ) 100 MG tablet TAKE 1 TABLET BYMOUTH FOR MIGRAINE MAY REPEAT IN 2 HOURS IF HEADACHE PERSISTS OR RECURS 10 tablet 12    tacrolimus  (PROTOPIC ) 0.03 % ointment Apply topically 2 (two) times daily. 100 g 0   triamcinolone  (KENALOG ) 0.025 % ointment Apply 1 application topically 2 (two) times daily. 30 g 0   No current facility-administered medications for this visit.    Allergies-reviewed and updated No Known Allergies  Social History   Socioeconomic History   Marital status: Married    Spouse name: Not on file   Number of children: Not on file   Years of education: Not on file   Highest education level: Not on file  Occupational History   Not on file  Tobacco Use   Smoking status: Never   Smokeless tobacco: Never  Substance and Sexual Activity   Alcohol use: Yes    Comment: occasionally   Drug use: No   Sexual activity: Yes    Partners: Female  Other Topics Concern   Not on file  Social History Narrative   Not on file   Social Drivers of Health   Financial Resource Strain: Not on file  Food Insecurity: Not on file  Transportation Needs: Not on file  Physical Activity: Not on file  Stress: Not on file  Social Connections: Not on file        Objective:  Physical Exam: BP 122/82   Pulse 67   Temp 98.2 F (36.8 C) (Temporal)   Ht 6' 2 (1.88 m)   Wt 201 lb 3.2 oz (91.3 kg)   SpO2 97%   BMI 25.83 kg/m   Body mass index is 25.83 kg/m. Wt Readings from Last 3 Encounters:  05/20/24 201 lb 3.2 oz (91.3 kg)  05/19/23 208 lb 6.4 oz (94.5 kg)  05/13/22 206 lb (93.4 kg)   Gen: NAD, resting comfortably HEENT: TMs normal bilaterally. OP clear. No thyromegaly noted.  CV: RRR with no murmurs appreciated Pulm: NWOB, CTAB with no crackles, wheezes, or rhonchi GI: Normal bowel sounds present. Soft, Nontender, Nondistended. MSK: no edema, cyanosis, or clubbing noted Skin: warm, dry Neuro: CN2-12 grossly intact. Strength 5/5 in upper and lower extremities. Reflexes symmetric and intact bilaterally.  Psych: Normal affect and thought content     Charles Mccormick M. Kennyth, MD 05/20/2024 8:32 AM

## 2025-05-25 ENCOUNTER — Encounter: Admitting: Family Medicine
